# Patient Record
Sex: Male | Born: 1985 | Race: White | Hispanic: No | Marital: Married | Smoking: Current every day smoker
Health system: Southern US, Community
[De-identification: ages and names within clinical notes are randomized; demographics above are authoritative.]

## PROBLEM LIST (undated history)

## (undated) DIAGNOSIS — F909 Attention-deficit hyperactivity disorder, unspecified type: Secondary | ICD-10-CM

## (undated) DIAGNOSIS — Z8781 Personal history of (healed) traumatic fracture: Secondary | ICD-10-CM

## (undated) HISTORY — PX: OTHER SURGICAL HISTORY: SHX169

## (undated) HISTORY — PX: CERVICAL SPINE SURGERY: SHX589

## (undated) HISTORY — DX: Attention-deficit hyperactivity disorder, unspecified type: F90.9

## (undated) HISTORY — DX: Personal history of (healed) traumatic fracture: Z87.81

---
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
## 2009-07-11 ENCOUNTER — Emergency Department (HOSPITAL_COMMUNITY): Admission: EM | Admit: 2009-07-11 | Discharge: 2009-07-11 | Payer: Self-pay | Admitting: Emergency Medicine
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
## 2015-10-04 ENCOUNTER — Encounter (HOSPITAL_COMMUNITY): Payer: Self-pay | Admitting: *Deleted

## 2015-10-04 ENCOUNTER — Emergency Department (HOSPITAL_COMMUNITY)
Admission: EM | Admit: 2015-10-04 | Discharge: 2015-10-05 | Disposition: A | Discharge status: Home / Self Care | Payer: 59 | Attending: Emergency Medicine | Admitting: Emergency Medicine

## 2015-10-04 ENCOUNTER — Emergency Department (HOSPITAL_COMMUNITY): Payer: 59

## 2015-10-04 DIAGNOSIS — R002 Palpitations: Secondary | ICD-10-CM

## 2015-10-04 DIAGNOSIS — Z87891 Personal history of nicotine dependence: Secondary | ICD-10-CM | POA: Insufficient documentation

## 2015-10-04 DIAGNOSIS — R079 Chest pain, unspecified: Secondary | ICD-10-CM | POA: Diagnosis not present

## 2015-10-04 LAB — BASIC METABOLIC PANEL
Anion gap: 9 (ref 5–15)
BUN: 11 mg/dL (ref 6–20)
CO2: 26 mmol/L (ref 22–32)
Calcium: 9.5 mg/dL (ref 8.9–10.3)
Chloride: 101 mmol/L (ref 101–111)
Creatinine, Ser: 0.9 mg/dL (ref 0.61–1.24)
GFR calc Af Amer: >60 mL/min (ref >60)
GFR calc non Af Amer: >60 mL/min (ref >60)
Glucose, Bld: 101 mg/dL — ABNORMAL HIGH (ref 65–99)
Potassium: 3.5 mmol/L (ref 3.5–5.1)
Sodium: 136 mmol/L (ref 135–145)

## 2015-10-04 LAB — CBC WITH DIFFERENTIAL/PLATELET
Basophils Absolute: 0 10*3/uL (ref 0.0–0.1)
Basophils Relative: 0 %
Eosinophils Absolute: 0.2 10*3/uL (ref 0.0–0.7)
Eosinophils Relative: 2 %
HCT: 45.5 % (ref 39.0–52.0)
Hemoglobin: 15.9 g/dL (ref 13.0–17.0)
Lymphocytes Relative: 24 %
Lymphs Abs: 1.8 10*3/uL (ref 0.7–4.0)
MCH: 30.3 pg (ref 26.0–34.0)
MCHC: 34.9 g/dL (ref 30.0–36.0)
MCV: 86.7 fL (ref 78.0–100.0)
Monocytes Absolute: 0.8 10*3/uL (ref 0.1–1.0)
Monocytes Relative: 11 %
Neutro Abs: 4.8 10*3/uL (ref 1.7–7.7)
Neutrophils Relative %: 63 %
Platelets: 188 10*3/uL (ref 150–400)
RBC: 5.25 MIL/uL (ref 4.22–5.81)
RDW: 12.3 % (ref 11.5–15.5)
WBC: 7.5 10*3/uL (ref 4.0–10.5)

## 2015-10-04 LAB — I-STAT TROPONIN, ED: Troponin i, poc: 0 ng/mL (ref 0.00–0.08)

## 2015-10-04 NOTE — ED Provider Notes (Signed)
CSN: [id number]     Arrival date & time 10/04/15  2223 History  By signing my name below, I, Doreatha MartinEva Mathews, attest that this documentation has been prepared under the direction and in the presence of Geoffery Lyonsouglas Shatarra Wehling, MD. Electronically Signed: Doreatha MartinEva Mathews, ED Scribe. 10/04/2015. 11:47 PM.    Chief Complaint  Patient presents with  . Chest Pain   Patient is a 29 y.o. male presenting with palpitations. The history is provided by the patient. No language interpreter was used.  Palpitations Palpitations quality:  Regular (fluttering) Onset quality:  Sudden Duration:  3 seconds Timing:  Intermittent Progression:  Improving Chronicity:  New Context: not caffeine and not nicotine   Associated symptoms: no chest pain, no lower extremity edema and no shortness of breath   Risk factors: no diabetes mellitus and no heart disease     HPI Comments: Rosalio MacadamiaMatthew A Clymer is a 29 y.o. male who presents to the Emergency Department complaining of multiple episodes of a sensation of heart palpitations, described as fluttering and lasting a few seconds, onset 2 hours ago while at rest. No episodes of fluttering since arrival. FHx of sudden cardiac death from long QT syndrome at age 29 from twin sister. Per EMS, his EKG showed an inverted T wave. H/o stress test 10 years ago with no acute findings. Otherwise healthy. No daily medications. 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 No h/o DM, HLD, HTN. Pt is a current non-smoker (former smoker). He denies caffiene consumption, daily alcohol use. He also denies any CP, leg swelling, SOB.  History reviewed. No pertinent past medical history. Past Surgical History  Procedure Laterality Date  . Broken neck     No family history on file. Social History  Substance Use Topics  . Smoking status: Former Games developermoker  . Smokeless tobacco: Current User  . Alcohol Use: Yes     Comment: ocassionally    Review of Systems  Respiratory: Negative for shortness of  breath.   Cardiovascular: Positive for palpitations. Negative for chest pain and leg swelling.  All other systems reviewed and are negative.  Allergies  Other  Home Medications   Prior to Admission medications   Not on File   BP 132/59 mmHg  Pulse 75  Temp(Src) 98.5 F (36.9 C) (Oral)  Resp 12  Ht 5\' 10"  (1.778 m)  Wt 225 lb (102.059 kg)  BMI 32.28 kg/m2  SpO2 98% Physical Exam  Constitutional: He is oriented to person, place, and time. He appears well-developed and well-nourished.  HENT:  Head: Normocephalic and atraumatic.  Eyes: EOM are normal.  Neck: Normal range of motion.  Cardiovascular: Normal rate, regular rhythm, normal heart sounds and intact distal pulses.   Pulmonary/Chest: Effort normal and breath sounds normal. No respiratory distress.  Lungs CTA bilaterally.   Abdominal: Soft. He exhibits no distension. There is no tenderness.  Musculoskeletal: Normal range of motion.  Neurological: He is alert and oriented to person, place, and time.  Skin: Skin is warm and dry.  Psychiatric: He has a normal mood and affect. Judgment normal.  Nursing note and vitals reviewed.  ED Course  Procedures (including critical care time) DIAGNOSTIC STUDIES: Oxygen Saturation is 98% on RA, normal by my interpretation.    COORDINATION OF CARE: 11:26 PM Discussed treatment plan with pt at bedside and pt agreed to plan.   Labs Review Labs Reviewed  BASIC METABOLIC PANEL - Abnormal; Notable for the following:    Glucose, Bld  101 (*)    All other components within normal limits  CBC WITH DIFFERENTIAL/PLATELET  Rosezena Sensor, ED    Imaging Review Dg Chest 2 View  10/04/2015  CLINICAL DATA:  Acute onset of chest pain and palpitations. EXAM: CHEST  2 VIEW COMPARISON:  None. FINDINGS: The heart size and mediastinal contours are within normal limits. Both lungs are clear. The visualized skeletal structures are unremarkable. IMPRESSION: Normal chest x-ray. Electronically Signed    By: Rudie Meyer M.D.   On: 10/04/2015 23:10   I have personally reviewed and evaluated these images and lab results as part of my medical decision-making.   EKG Interpretation   Date/Time:  Saturday October 04 2015 22:32:10 EDT Ventricular Rate:  90 PR Interval:  144 QRS Duration: 86 QT Interval:  352 QTC Calculation: 430 R Axis:   38 Text Interpretation:  Normal sinus rhythm Normal ECG Confirmed by Terion Hedman   MD, Gamal Todisco ([id number]) on 10/04/2015 11:00:09 PM      MDM   Final diagnoses:  None    Patient presents with palpitations that started at home while watching television. He is now in a normal sinus rhythm with no ectopy and normal ekg.  Labs are unremarkable.  Will discharge to home, to follow up with cardiology if symptoms persist to discuss holter monitoring.  I personally performed the services described in this documentation, which was scribed in my presence. The recorded information has been reviewed and is accurate.       Geoffery Lyons, MD 10/05/15 [phone]

## 2015-10-04 NOTE — ED Notes (Signed)
Patient stated he was watching TV and felt like his heart was fluttering.  Called EMS and they ran an EKG and it showed inverted T waves

## 2015-10-05 NOTE — Discharge Instructions (Signed)
Limit your caffeine and alcohol intake.  If symptoms persist, call the cardiology clinic to set up a follow up appointment to discuss holter monitoring.  Their contact information has been provided in this discharge summary.   Palpitations A palpitation is the feeling that your heartbeat is irregular or is faster than normal. It may feel like your heart is fluttering or skipping a beat. Palpitations are usually not a serious problem. However, in some cases, you may need further medical evaluation. CAUSES  Palpitations can be caused by:  Smoking.  Caffeine or other stimulants, such as diet pills or energy drinks.  Alcohol.  Stress and anxiety.  Strenuous physical activity.  Fatigue.  Certain medicines.  Heart disease, especially if you have a history of irregular heart rhythms (arrhythmias), such as atrial fibrillation, atrial flutter, or supraventricular tachycardia.  An improperly working pacemaker or defibrillator. DIAGNOSIS  To find the cause of your palpitations, your health care provider will take your medical history and perform a physical exam. Your health care provider may also have you take a test called an ambulatory electrocardiogram (ECG). An ECG records your heartbeat patterns over a 24-hour period. You may also have other tests, such as:  Transthoracic echocardiogram (TTE). During echocardiography, sound waves are used to evaluate how blood flows through your heart.  Transesophageal echocardiogram (TEE).  Cardiac monitoring. This allows your health care provider to monitor your heart rate and rhythm in real time.  Holter monitor. This is a portable device that records your heartbeat and can help diagnose heart arrhythmias. It allows your health care provider to track your heart activity for several days, if needed.  Stress tests by exercise or by giving medicine that makes the heart beat faster. TREATMENT  Treatment of palpitations depends on the cause of your  symptoms and can vary greatly. Most cases of palpitations do not require any treatment other than time, relaxation, and monitoring your symptoms. Other causes, such as atrial fibrillation, atrial flutter, or supraventricular tachycardia, usually require further treatment. HOME CARE INSTRUCTIONS   Avoid:  Caffeinated coffee, tea, soft drinks, diet pills, and energy drinks.  Chocolate.  Alcohol.  Stop smoking if you smoke.  Reduce your stress and anxiety. Things that can help you relax include:  A method of controlling things in your body, such as your heartbeats, with your mind (biofeedback).  Yoga.  Meditation.  Physical activity such as swimming, jogging, or walking.  Get plenty of rest and sleep. SEEK MEDICAL CARE IF:   You continue to have a fast or irregular heartbeat beyond 24 hours.  Your palpitations occur more often. SEEK IMMEDIATE MEDICAL CARE IF:  You have chest pain or shortness of breath.  You have a severe headache.  You feel dizzy or you faint. MAKE SURE YOU:  Understand these instructions.  Will watch your condition.  Will get help right away if you are not doing well or get worse.   This information is not intended to replace advice given to you by your health care provider. Make sure you discuss any questions you have with your health care provider.   Document Released: 11/12/2000 Document Revised: 11/20/2013 Document Reviewed: 01/14/2012 Elsevier Interactive Patient Education [employer]2016 Elsevier Inc.

## 2015-10-05 NOTE — ED Notes (Signed)
Pt stable, ambulatory, states understanding of discharge instructions 

## 2016-03-10 ENCOUNTER — Encounter: Payer: Self-pay | Admitting: Podiatry

## 2016-03-10 ENCOUNTER — Ambulatory Visit (INDEPENDENT_AMBULATORY_CARE_PROVIDER_SITE_OTHER): Payer: BLUE CROSS/BLUE SHIELD | Admitting: Podiatry

## 2016-03-10 VITALS — BP 135/87 | HR 81 | Resp 16

## 2016-03-10 DIAGNOSIS — B353 Tinea pedis: Secondary | ICD-10-CM | POA: Diagnosis not present

## 2016-03-10 DIAGNOSIS — B351 Tinea unguium: Secondary | ICD-10-CM

## 2016-03-10 MED ORDER — NAFTIFINE HCL 2 % EX GEL: 1.0000 "application " | Freq: Two times a day (BID) | CUTANEOUS | Status: DC

## 2016-03-10 MED ORDER — TERBINAFINE HCL 250 MG PO TABS: 250.0000 mg | ORAL_TABLET | Freq: Every day | ORAL | Status: DC

## 2016-03-10 NOTE — Progress Notes (Signed)
   Subjective:    Patient ID: Tony Thornton, male    DOB: 11/25/1986, 30 y.o.   MRN: [id number]  HPI  Chief Complaint  Patient presents with  . Nail Problem    Toenails bilateral - thick, discolored nails for several years, worsening  . Skin Problem    Plantar feet and around toes bilateral - itching, peeling, redness, tried several OTC meds, creams and sprays, change socks through the day-no help.      Review of Systems  All other systems reviewed and are negative.      Objective:   Physical Exam        Assessment & Plan:

## 2016-03-11 LAB — HEPATIC FUNCTION PANEL
ALT: 26 U/L (ref 9–46)
AST: 22 U/L (ref 10–40)
Albumin: 4.5 g/dL (ref 3.6–5.1)
Alkaline Phosphatase: 82 U/L (ref 40–115)
Bilirubin, Direct: 0.1 mg/dL (ref <=0.2)
Indirect Bilirubin: 0.3 mg/dL (ref 0.2–1.2)
Total Bilirubin: 0.4 mg/dL (ref 0.2–1.2)
Total Protein: 6.6 g/dL (ref 6.1–8.1)

## 2016-03-11 NOTE — Progress Notes (Signed)
Subjective:     Patient ID: Rosalio MacadamiaMatthew A Poss, male   DOB: December 24, 1985, 30 y.o.   MRN: [id number]  HPI patient presents stating that his toenails are thick and he gets cracking between his toes and he gets redness when he wears boots and he has to wear boots with work and he does have sweating and dry skin   Review of Systems  All other systems reviewed and are negative.      Objective:   Physical Exam  Constitutional: He is oriented to person, place, and time.  Cardiovascular: Intact distal pulses.   Musculoskeletal: Normal range of motion.  Neurological: He is oriented to person, place, and time.  Skin: Skin is warm.  Nursing note and vitals reviewed.  Neurovascular status intact muscle strength adequate range of motion within normal limits with patient found to have nail disease 1-5 both feet with irritation of tissue between all digits and redness of his forefoot bilateral. He gets a lot of itching mechanisms and he does get occasional blisters. Was found to have good digital perfusion and is well oriented 3     Assessment:     Fungal infection with nail disease also noted    Plan:     H&P conditions reviewed and I've recommended Lamisil 90 days 1 per day and we'll also get liver function studies prior to initiating treatment. I did send his prescription off today and gave him a form to get blood work done and also he will start form and done to try to control sweating

## 2016-03-15 ENCOUNTER — Telehealth: Payer: Self-pay | Admitting: *Deleted

## 2016-03-15 MED ORDER — NAFTIFINE HCL 2 % EX GEL: 1.0000 "application " | Freq: Two times a day (BID) | CUTANEOUS | Status: DC

## 2016-03-15 NOTE — Telephone Encounter (Addendum)
[facility] pharmacy to [facility] for prior authorization purposes.  03/17/2016-Pt states he was to get a gel for his feet, but his insurance did not cover.  I told pt I had changed the rx to a pharmacy that would process for prior authorization, that he could wait for that medication or I could see if Dr. Charlsie Merlesegal would change to another.  Pt states he would like to see if Dr. Charlsie Merlesegal would change to another medication. 03/18/2016-I spoke with Tamera PuntMiranda - [employer] and she states Ketoconazole cream 60 grams is covered and will cost pt $74.00 for 30 days, Lotrimin AF Cream is $10.00 depending on the pharmacy and not covered.  I informed pt of the options and he states he would prefer the Lotrimin AF. Orders to [facility]'s [facility].

## 2016-03-18 MED ORDER — CLOTRIMAZOLE 1 % EX CREA: 1.0000 "application " | TOPICAL_CREAM | Freq: Two times a day (BID) | CUTANEOUS | Status: DC

## 2016-03-23 ENCOUNTER — Telehealth: Payer: Self-pay | Admitting: *Deleted

## 2016-04-07 ENCOUNTER — Telehealth: Payer: Self-pay | Admitting: *Deleted

## 2016-04-07 MED ORDER — NAFTIFINE HCL 2 % EX GEL: 1.0000 [drp] | Freq: Two times a day (BID) | CUTANEOUS | Status: DC

## 2016-04-07 NOTE — Telephone Encounter (Signed)
Pre-cert for Naftin 2% gel, Key:  [id number], valid 04/02/2016 - 11/28/2038. Faxed to [facility] [id number].

## 2016-04-07 NOTE — Telephone Encounter (Signed)
Dr. Charlsie Merlesegal reviewed 03/10/2016 blood work as with in normal limits may take the lamisil.  I also informed pt, our office had received pre-cert for the Naftin gel if he would like to pick that up as well.  Pt states he will get both medication.

## 2016-05-01 IMAGING — DX DG CHEST 2V
2 series · 2 of 2 positions shown · non-contrast
Comparison: None.

CLINICAL DATA: Acute onset of chest pain and palpitations.

EXAM:
CHEST  2 VIEW

[chest pa]
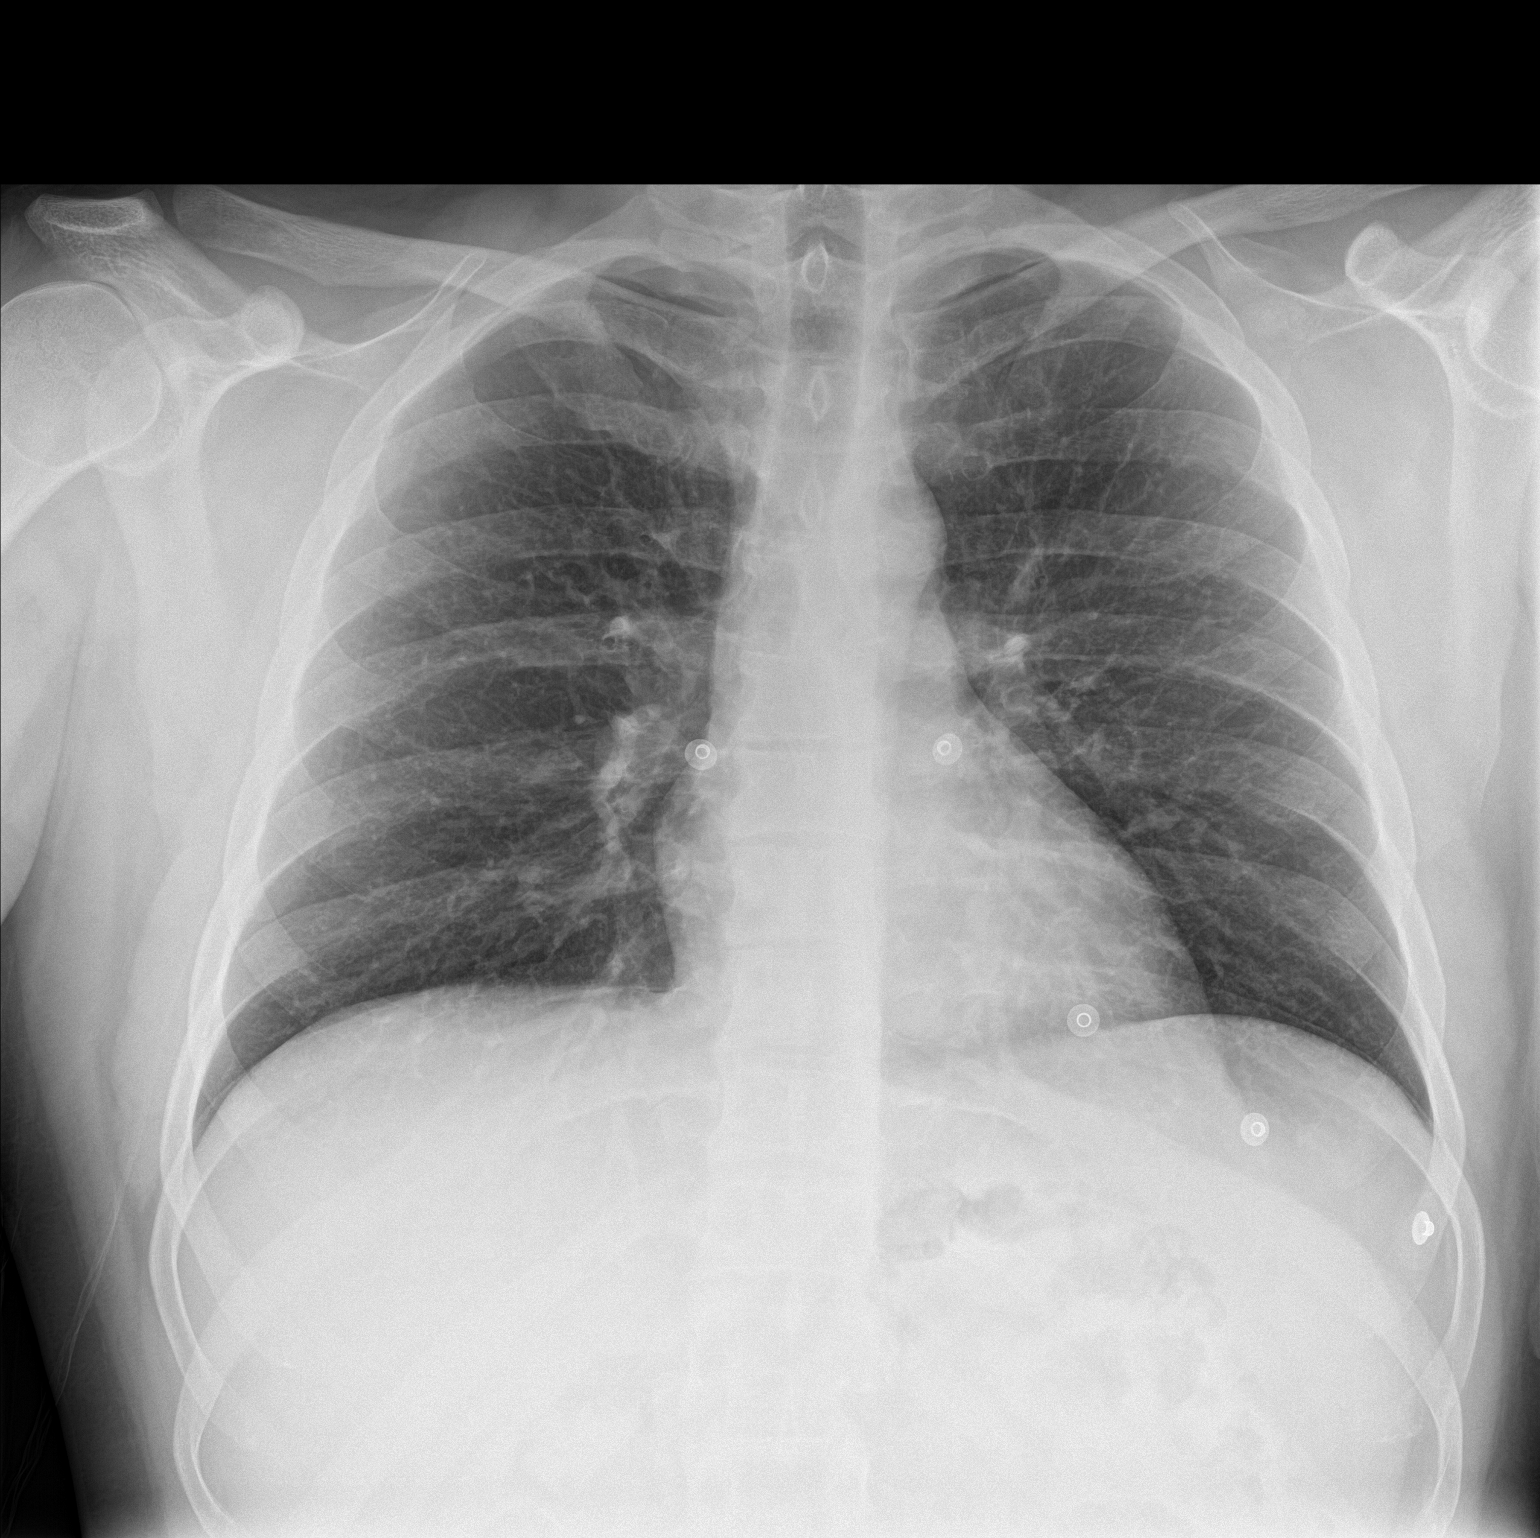

[chest lat]
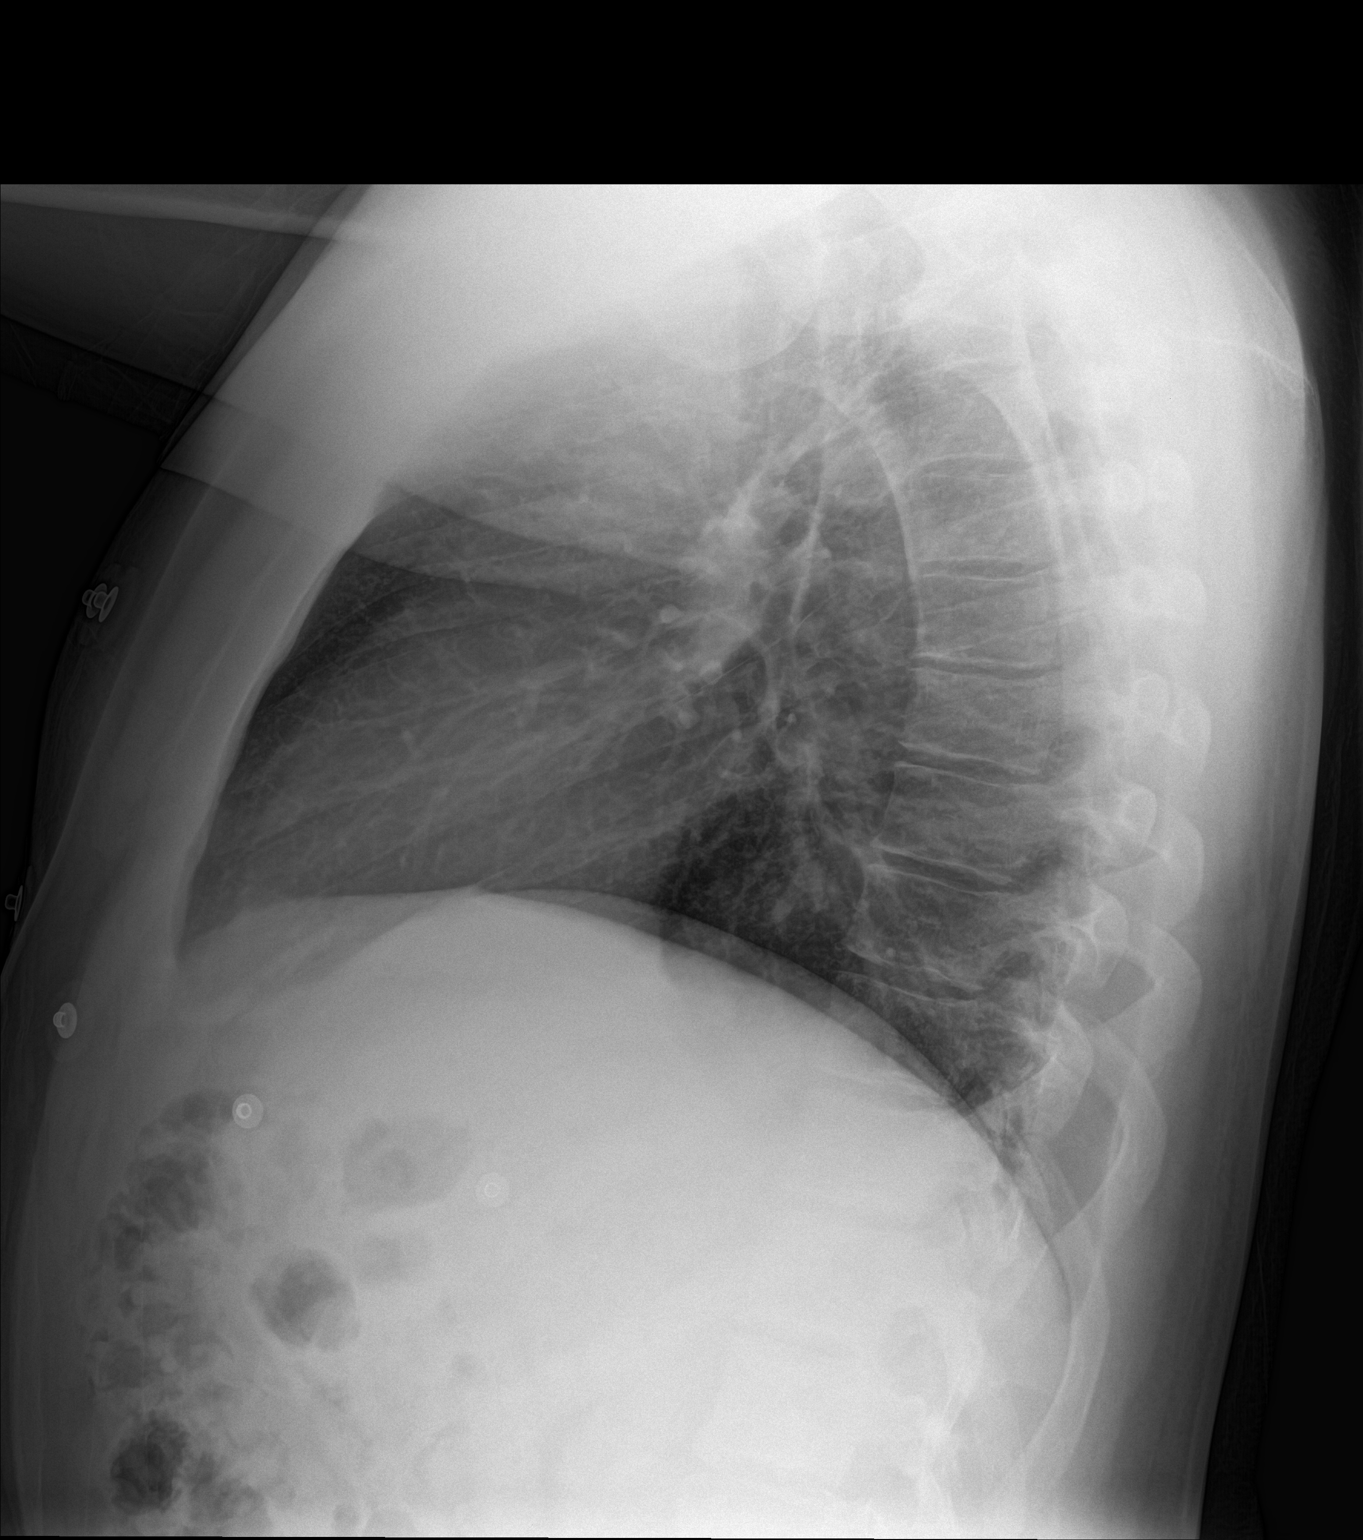

[2 of 2 positions shown; findings below may reference images not displayed]

FINDINGS: The heart size and mediastinal contours are within normal limits.
Both lungs are clear. The visualized skeletal structures are
unremarkable.
IMPRESSION: Normal chest x-ray.

## 2016-07-07 ENCOUNTER — Ambulatory Visit (INDEPENDENT_AMBULATORY_CARE_PROVIDER_SITE_OTHER): Payer: BLUE CROSS/BLUE SHIELD | Admitting: Podiatry

## 2016-07-07 DIAGNOSIS — B351 Tinea unguium: Secondary | ICD-10-CM | POA: Diagnosis not present

## 2016-07-07 DIAGNOSIS — B353 Tinea pedis: Secondary | ICD-10-CM

## 2016-07-07 MED ORDER — TERBINAFINE HCL 250 MG PO TABS: ORAL_TABLET | ORAL | 0 refills | Status: DC

## 2016-07-08 NOTE — Progress Notes (Signed)
Subjective:     Patient ID: Rosalio MacadamiaMatthew A Cupo, male   DOB: 1986/06/07, 30 y.o.   MRN: [id number]  HPI patient presents stating that he is doing better and the discoloration has resolved and his skin is much better   Review of Systems     Objective:   Physical Exam Neurovascular status intact muscle strength adequate with patient noted to have significant reduction of discoloration nailbeds with some still distal yellow numbness and thickness with skin that's much improved in the interdigital areas    Assessment:     Doing much better from having significant fungal infection of nails and skin bilateral    Plan:     Reviewed condition and recommended that we do pulse Lamisil but not to start for about 4 months. I wrote a prescription today we'll start December and I educated him on this condition and we will hopefully be able to utilize pulse therapy in the future to control. He'll be seen back to recheck as needed

## 2017-09-21 DIAGNOSIS — T23222A Burn of second degree of single left finger (nail) except thumb, initial encounter: Secondary | ICD-10-CM | POA: Diagnosis not present

## 2017-12-08 ENCOUNTER — Other Ambulatory Visit: Payer: Self-pay | Admitting: Adult Health

## 2017-12-08 ENCOUNTER — Encounter: Payer: Self-pay | Admitting: Adult Health

## 2017-12-08 ENCOUNTER — Encounter: Payer: Self-pay | Admitting: General Practice

## 2017-12-08 ENCOUNTER — Ambulatory Visit (INDEPENDENT_AMBULATORY_CARE_PROVIDER_SITE_OTHER): Payer: BLUE CROSS/BLUE SHIELD | Admitting: Adult Health

## 2017-12-08 VITALS — BP 124/73 | HR 79 | Ht 69.0 in | Wt 229.6 lb

## 2017-12-08 DIAGNOSIS — Z23 Encounter for immunization: Secondary | ICD-10-CM

## 2017-12-08 DIAGNOSIS — Z0000 Encounter for general adult medical examination without abnormal findings: Secondary | ICD-10-CM

## 2017-12-08 DIAGNOSIS — Z114 Encounter for screening for human immunodeficiency virus [HIV]: Secondary | ICD-10-CM | POA: Diagnosis not present

## 2017-12-08 DIAGNOSIS — Z83438 Family history of other disorder of lipoprotein metabolism and other lipidemia: Secondary | ICD-10-CM | POA: Diagnosis not present

## 2017-12-08 DIAGNOSIS — F909 Attention-deficit hyperactivity disorder, unspecified type: Secondary | ICD-10-CM | POA: Diagnosis not present

## 2017-12-08 DIAGNOSIS — R5383 Other fatigue: Secondary | ICD-10-CM

## 2017-12-08 DIAGNOSIS — Z7251 High risk heterosexual behavior: Secondary | ICD-10-CM

## 2017-12-08 DIAGNOSIS — Z8349 Family history of other endocrine, nutritional and metabolic diseases: Secondary | ICD-10-CM | POA: Diagnosis not present

## 2017-12-08 DIAGNOSIS — N529 Male erectile dysfunction, unspecified: Secondary | ICD-10-CM

## 2017-12-08 DIAGNOSIS — R8290 Unspecified abnormal findings in urine: Secondary | ICD-10-CM | POA: Diagnosis not present

## 2017-12-08 LAB — POCT URINALYSIS DIPSTICK
Bilirubin, UA: NEGATIVE
Blood, UA: NEGATIVE
Glucose, UA: NEGATIVE
Ketones, UA: NEGATIVE
Leukocytes, UA: NEGATIVE
Nitrite, UA: NEGATIVE
Protein, UA: NEGATIVE
Spec Grav, UA: 1.025 (ref 1.010–1.025)
Urobilinogen, UA: 1 E.U./dL
pH, UA: 6.5 (ref 5.0–8.0)

## 2017-12-08 MED ORDER — SILDENAFIL CITRATE 50 MG PO TABS: ORAL_TABLET | ORAL | 0 refills | Status: DC

## 2017-12-08 NOTE — Assessment & Plan Note (Signed)
Continue excellent water intake and reduce saturated fat intake. We will call you when urine results are available. Please schedule fasting lab appt and return ADD/ADHD questionnaire paperwork. Please schedule complete physical in the next few months.

## 2017-12-08 NOTE — Patient Instructions (Addendum)
Heart-Healthy Eating Plan Many factors influence your heart health, including eating and exercise habits. Heart (coronary) risk increases with abnormal blood fat (lipid) levels. Heart-healthy meal planning includes limiting unhealthy fats, increasing healthy fats, and making other small dietary changes. This includes maintaining a healthy body weight to help keep lipid levels within a normal range. What is my plan? Your health care provider recommends that you:  Get no more than __25___% of the total calories in your daily diet from fat.  Limit your intake of saturated fat to less than ____5____% of your total calories each day.  Limit the amount of cholesterol in your diet to less than _300__ mg per day.  What types of fat should I choose?  Choose healthy fats more often. Choose monounsaturated and polyunsaturated fats, such as olive oil and canola oil, flaxseeds, walnuts, almonds, and seeds.  Eat more omega-3 fats. Good choices include salmon, mackerel, sardines, tuna, flaxseed oil, and ground flaxseeds. Aim to eat fish at least two times each week.  Limit saturated fats. Saturated fats are primarily found in animal products, such as meats, butter, and cream. Plant sources of saturated fats include palm oil, palm kernel oil, and coconut oil.  Avoid foods with partially hydrogenated oils in them. These contain trans fats. Examples of foods that contain trans fats are stick margarine, some tub margarines, cookies, crackers, and other baked goods. What general guidelines do I need to follow?  Check food labels carefully to identify foods with trans fats or high amounts of saturated fat.  Fill one half of your plate with vegetables and green salads. Eat 4-5 servings of vegetables per day. A serving of vegetables equals 1 cup of raw leafy vegetables,  cup of raw or cooked cut-up vegetables, or  cup of vegetable juice.  Fill one fourth of your plate with whole grains. Look for the word  "whole" as the first word in the ingredient list.  Fill one fourth of your plate with lean protein foods.  Eat 4-5 servings of fruit per day. A serving of fruit equals one medium whole fruit,  cup of dried fruit,  cup of fresh, frozen, or canned fruit, or  cup of 100% fruit juice.  Eat more foods that contain soluble fiber. Examples of foods that contain this type of fiber are apples, broccoli, carrots, beans, peas, and barley. Aim to get 20-30 g of fiber per day.  Eat more home-cooked food and less restaurant, buffet, and fast food.  Limit or avoid alcohol.  Limit foods that are high in starch and sugar.  Avoid fried foods.  Cook foods by using methods other than frying. Baking, boiling, grilling, and broiling are all great options. Other fat-reducing suggestions include: ? Removing the skin from poultry. ? Removing all visible fats from meats. ? Skimming the fat off of stews, soups, and gravies before serving them. ? Steaming vegetables in water or broth.  Lose weight if you are overweight. Losing just 5-10% of your initial body weight can help your overall health and prevent diseases such as diabetes and heart disease.  Increase your consumption of nuts, legumes, and seeds to 4-5 servings per week. One serving of dried beans or legumes equals  cup after being cooked, one serving of nuts equals 1 ounces, and one serving of seeds equals  ounce or 1 tablespoon.  You may need to monitor your salt (sodium) intake, especially if you have high blood pressure. Talk with your health care provider or dietitian to get  more information about reducing sodium. What foods can I eat? Grains  Breads, including French, white, pita, wheat, raisin, rye, oatmeal, and Italian. Tortillas that are neither fried nor made with lard or trans fat. Low-fat rolls, including hotdog and hamburger buns and English muffins. Biscuits. Muffins. Waffles. Pancakes. Light popcorn. Whole-grain cereals. Flatbread.  Melba toast. Pretzels. Breadsticks. Rusks. Low-fat snacks and crackers, including oyster, saltine, matzo, graham, animal, and rye. Rice and pasta, including brown rice and those that are made with whole wheat. Vegetables All vegetables. Fruits All fruits, but limit coconut. Meats and Other Protein Sources Lean, well-trimmed beef, veal, pork, and lamb. Chicken and turkey without skin. All fish and shellfish. Wild duck, rabbit, pheasant, and venison. Egg whites or low-cholesterol egg substitutes. Dried beans, peas, lentils, and tofu.Seeds and most nuts. Dairy Low-fat or nonfat cheeses, including ricotta, string, and mozzarella. Skim or 1% milk that is liquid, powdered, or evaporated. Buttermilk that is made with low-fat milk. Nonfat or low-fat yogurt. Beverages Mineral water. Diet carbonated beverages. Sweets and Desserts Sherbets and fruit ices. Honey, jam, marmalade, jelly, and syrups. Meringues and gelatins. Pure sugar candy, such as hard candy, jelly beans, gumdrops, mints, marshmallows, and small amounts of dark chocolate. Angel food cake. Eat all sweets and desserts in moderation. Fats and Oils Nonhydrogenated (trans-free) margarines. Vegetable oils, including soybean, sesame, sunflower, olive, peanut, safflower, corn, canola, and cottonseed. Salad dressings or mayonnaise that are made with a vegetable oil. Limit added fats and oils that you use for cooking, baking, salads, and as spreads. Other Cocoa powder. Coffee and tea. All seasonings and condiments. The items listed above may not be a complete list of recommended foods or beverages. Contact your dietitian for more options. What foods are not recommended? Grains Breads that are made with saturated or trans fats, oils, or whole milk. Croissants. Butter rolls. Cheese breads. Sweet rolls. Donuts. Buttered popcorn. Chow mein noodles. High-fat crackers, such as cheese or butter crackers. Meats and Other Protein Sources Fatty meats, such  as hotdogs, short ribs, sausage, spareribs, bacon, ribeye roast or steak, and mutton. High-fat deli meats, such as salami and bologna. Caviar. Domestic duck and goose. Organ meats, such as kidney, liver, sweetbreads, brains, gizzard, chitterlings, and heart. Dairy Cream, sour cream, cream cheese, and creamed cottage cheese. Whole milk cheeses, including blue (bleu), Monterey Jack, Brie, [place], American, Havarti, Swiss, cheddar, Camembert, and [place]. Whole or 2% milk that is liquid, evaporated, or condensed. Whole buttermilk. Cream sauce or high-fat cheese sauce. Yogurt that is made from whole milk. Beverages Regular sodas and drinks with added sugar. Sweets and Desserts Frosting. Pudding. Cookies. Cakes other than angel food cake. Candy that has milk chocolate or white chocolate, hydrogenated fat, butter, coconut, or unknown ingredients. Buttered syrups. Full-fat ice cream or ice cream drinks. Fats and Oils Gravy that has suet, meat fat, or shortening. Cocoa butter, hydrogenated oils, palm oil, coconut oil, palm kernel oil. These can often be found in baked products, candy, fried foods, nondairy creamers, and whipped toppings. Solid fats and shortenings, including bacon fat, salt pork, lard, and butter. Nondairy cream substitutes, such as coffee creamers and sour cream substitutes. Salad dressings that are made of unknown oils, cheese, or sour cream. The items listed above may not be a complete list of foods and beverages to avoid. Contact your dietitian for more information. This information is not intended to replace advice given to you by your health care provider. Make sure you discuss any questions you have with your health care   provider. Document Released: 08/24/2008 Document Revised: 06/04/2016 Document Reviewed: 05/09/2014 Elsevier Interactive Patient Education  2018 [place].   Neck Exercises Neck exercises can be important for many reasons:  They can help you to improve and  maintain flexibility in your neck. This can be especially important as you age.  They can help to make your neck stronger. This can make movement easier.  They can reduce or prevent neck pain.  They may help your upper back.  Ask your health care provider which neck exercises would be best for you. Exercises Neck Press Repeat this exercise 10 times. Do it first thing in the morning and right before bed or as told by your health care provider. 1. Lie on your back on a firm bed or on the floor with a pillow under your head. 2. Use your neck muscles to push your head down on the pillow and straighten your spine. 3. Hold the position as well as you can. Keep your head facing up and your chin tucked. 4. Slowly count to 5 while holding this position. 5. Relax for a few seconds. Then repeat.  Isometric Strengthening Do a full set of these exercises 2 times a day or as told by your health care provider. 1. Sit in a supportive chair and place your hand on your forehead. 2. Push forward with your head and neck while pushing back with your hand. Hold for 10 seconds. 3. Relax. Then repeat the exercise 3 times. 4. Next, do thesequence again, this time putting your hand against the back of your head. Use your head and neck to push backward against the hand pressure. 5. Finally, do the same exercise on either side of your head, pushing sideways against the pressure of your hand.  Prone Head Lifts Repeat this exercise 5 times. Do this 2 times a day or as told by your health care provider. 1. Lie face-down, resting on your elbows so that your chest and upper back are raised. 2. Start with your head facing downward, near your chest. Position your chin either on or near your chest. 3. Slowly lift your head upward. Lift until you are looking straight ahead. Then continue lifting your head as far back as you can stretch. 4. Hold your head up for 5 seconds. Then slowly lower it to your starting  position.  Supine Head Lifts Repeat this exercise 8-10 times. Do this 2 times a day or as told by your health care provider. 1. Lie on your back, bending your knees to point to the ceiling and keeping your feet flat on the floor. 2. Lift your head slowly off the floor, raising your chin toward your chest. 3. Hold for 5 seconds. 4. Relax and repeat.  Scapular Retraction Repeat this exercise 5 times. Do this 2 times a day or as told by your health care provider. 1. Stand with your arms at your sides. Look straight ahead. 2. Slowly pull both shoulders backward and downward until you feel a stretch between your shoulder blades in your upper back. 3. Hold for 10-30 seconds. 4. Relax and repeat.  Contact a health care provider if:  Your neck pain or discomfort gets much worse when you do an exercise.  Your neck pain or discomfort does not improve within 2 hours after you exercise. If you have any of these problems, stop exercising right away. Do not do the exercises again unless your health care provider says that you can. Get help right away if:  You develop sudden, severe neck pain. If this happens, stop exercising right away. Do not do the exercises again unless your health care provider says that you can. Exercises Neck Stretch  Repeat this exercise 3-5 times. 1. Do this exercise while standing or while sitting in a chair. 2. Place your feet flat on the floor, shoulder-width apart. 3. Slowly turn your head to the right. Turn it all the way to the right so you can look over your right shoulder. Do not tilt or tip your head. 4. Hold this position for 10-30 seconds. 5. Slowly turn your head to the left, to look over your left shoulder. 6. Hold this position for 10-30 seconds.  Neck Retraction Repeat this exercise 8-10 times. Do this 3-4 times a day or as told by your health care provider. 1. Do this exercise while standing or while sitting in a sturdy chair. 2. Look straight ahead.  Do not bend your neck. 3. Use your fingers to push your chin backward. Do not bend your neck for this movement. Continue to face straight ahead. If you are doing the exercise properly, you will feel a slight sensation in your throat and a stretch at the back of your neck. 4. Hold the stretch for 1-2 seconds. Relax and repeat.  This information is not intended to replace advice given to you by your health care provider. Make sure you discuss any questions you have with your health care provider. Document Released: 10/27/2015 Document Revised: 04/22/2016 Document Reviewed: 05/26/2015 Elsevier Interactive Patient Education  2018 [employer].   Erectile Dysfunction Erectile dysfunction (ED) is the inability to get or keep an erection in order to have sexual intercourse. Erectile dysfunction may include:  Inability to get an erection.  Lack of enough hardness of the erection to allow penetration.  Loss of the erection before sex is finished.  What are the causes? This condition may be caused by:  Certain medicines, such as: ? Pain relievers. ? Antihistamines. ? Antidepressants. ? Blood pressure medicines. ? Water pills (diuretics). ? Ulcer medicines. ? Muscle relaxants. ? Drugs.  Excessive drinking.  Psychological causes, such as: ? Anxiety. ? Depression. ? Sadness. ? Exhaustion. ? Performance fear. ? Stress.  Physical causes, such as: ? Artery problems. This may include diabetes, smoking, liver disease, or atherosclerosis. ? High blood pressure. ? Hormonal problems, such as low testosterone. ? Obesity. ? Nerve problems. This may include back or pelvic injuries, diabetes mellitus, multiple sclerosis, or Parkinson disease.  What are the signs or symptoms? Symptoms of this condition include:  Inability to get an erection.  Lack of enough hardness of the erection to allow penetration.  Loss of the erection before sex is finished.  Normal erections at some times, but  with frequent unsatisfactory episodes.  Low sexual satisfaction in either partner due to erection problems.  A curved penis occurring with erection. The curve may cause pain or the penis may be too curved to allow for intercourse.  Never having nighttime erections.  How is this diagnosed? This condition is often diagnosed by:  Performing a physical exam to find other diseases or specific problems with the penis.  Asking you detailed questions about the problem.  Performing blood tests to check for diabetes mellitus or to measure hormone levels.  Performing other tests to check for underlying health conditions.  Performing an ultrasound exam to check for scarring.  Performing a test to check blood flow to the penis.  Doing a sleep study at home  to measure nighttime erections.  How is this treated? This condition may be treated by:  Medicine taken by mouth to help you achieve an erection (oral medicine).  Hormone replacement therapy to replace low testosterone levels.  Medicine that is injected into the penis. Your health care provider may instruct you how to give yourself these injections at home.  Vacuum pump. This is a pump with a ring on it. The pump and ring are placed on the penis and used to create pressure that helps the penis become erect.  Penile implant surgery. In this procedure, you may receive: ? An inflatable implant. This consists of cylinders, a pump, and a reservoir. The cylinders can be inflated with a fluid that helps to create an erection, and they can be deflated after intercourse. ? A semi-rigid implant. This consists of two silicone rubber rods. The rods provide some rigidity. They are also flexible, so the penis can both curve downward in its normal position and become straight for sexual intercourse.  Blood vessel surgery, to improve blood flow to the penis. During this procedure, a blood vessel from a different part of the body is placed into the penis  to allow blood to flow around (bypass) damaged or blocked blood vessels.  Lifestyle changes, such as exercising more, losing weight, and quitting smoking.  Follow these instructions at home: Medicines  Take over-the-counter and prescription medicines only as told by your health care provider. Do not increase the dosage without first discussing it with your health care provider.  If you are using self-injections, perform injections as directed by your health care provider. Make sure to avoid any veins that are on the surface of the penis. After giving an injection, apply pressure to the injection site for 5 minutes. General instructions  Exercise regularly, as directed by your health care provider. Work with your health care provider to lose weight, if needed.  Do not use any products that contain nicotine or tobacco, such as cigarettes and e-cigarettes. If you need help quitting, ask your health care provider.  Before using a vacuum pump, read the instructions that come with the pump and discuss any questions with your health care provider.  Keep all follow-up visits as told by your health care provider. This is important. Contact a health care provider if:  You feel nauseous.  You vomit. Get help right away if:  You are taking oral or injectable medicines and you have an erection that lasts longer than 4 hours. If your health care provider is unavailable, go to the nearest emergency room for evaluation. An erection that lasts much longer than 4 hours can result in permanent damage to your penis.  You have severe pain in your groin or abdomen.  You develop redness or severe swelling of your penis.  You have redness spreading up into your groin or lower abdomen.  You are unable to urinate.  You experience chest pain or a rapid heart beat (palpitations) after taking oral medicines. Summary  Erectile dysfunction (ED) is the inability to get or keep an erection during sexual  intercourse. This problem can usually be treated successfully.  This condition is diagnosed based on a physical exam, your symptoms, and tests to determine the cause. Treatment varies depending on the cause, and may include medicines, hormone therapy, surgery, or vacuum pump.  You may need follow-up visits to make sure that you are using your medicines or devices correctly.  Get help right away if you are taking or  injecting medicines and you have an erection that lasts longer than 4 hours. This information is not intended to replace advice given to you by your health care provider. Make sure you discuss any questions you have with your health care provider. Document Released: 11/12/2000 Document Revised: 12/01/2016 Document Reviewed: 12/01/2016 Elsevier Interactive Patient Education  2017 [employer]   Continue excellent water intake and reduce saturated fat intake. We will call you when urine results are available. Please schedule fasting lab appt and return ADD/ADHD questionnaire paperwork. Please schedule complete physical in the next few months. Short rx for Viagra provided, try to reduce stress/anxiety, which should help reduce symptoms. WELCOME TO THE PRACTICE!

## 2017-12-08 NOTE — Progress Notes (Signed)
Subjective:    Patient ID: Tony Thornton, male    DOB: 1986-01-28, 32 y.o.   MRN: [id number]  HPI:  Tony Thornton is here to establish as a new pt.  Tony Thornton is a pleasant 32 year old male.  PMH:  Tobacco Use- pack/day for 14 years.  Declined smoking cessation today.  His twin sister passed away from suspected prolong QT interval.  She was found deceased at home at age 35.  Tony Thornton underwent full cardiac work-up at [facility]- all negative Tony Thornton experienced palpitations 2016- EMS transport to hospital, work-up negative Tony Thornton denies daily medications and has not been to PCP since his pediatrician 13 years ago. Tony Thornton had cervical neck fx at age 64, surgically repaired with ACDF. Tony Thornton has several issues to discuss today- 1) Malodorous, cloudy urine for the last 3 months.  Tony Thornton is in monogamous relationship for the last 3 months,and denies penile d/c, genital lesions or hx of STIs.  Tony Thornton denies flank pain or blood in urine/stool.  Tony Thornton denies this occurring before. 2) ED for the last 6 months, difficulty getting/maintaining erection.  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  Tony Thornton denies this occurring before. 3) Loud snoring 4) Neck stiffness- present for years and worsens when sleeping. 5) Hx of ADHD treated, last time Tony Thornton was on meds was in highschool. Tony Thornton is currently struggling with poor concentration and inability to focus.  Patient Care Team    Relationship Specialty Notifications Start End  Julaine Fusi, NP PCP - General Family Medicine  12/08/17     Patient Active Problem List   Diagnosis Date Noted  . Malodorous urine 12/08/2017  . Healthcare maintenance 12/08/2017  . Erectile dysfunction 12/08/2017  . ADHD 12/08/2017     Past Medical History:  Diagnosis Date  . ADHD   . H/O cervical fracture      Past Surgical History:  Procedure Laterality Date  . broken neck       Family History  Problem Relation Age of Onset  . Hypertension Father   . Alcohol abuse  Father   . Hyperlipidemia Father   . Early death Sister        age 74- unknown cause  . Dementia Maternal Grandmother   . COPD Maternal Grandfather        colon  . Hypertension Paternal Grandmother   . Hyperlipidemia Paternal Grandmother   . COPD Paternal Grandmother   . Hypothyroidism Paternal Grandmother   . Cancer Paternal Grandfather        esophageal     Social History   Substance and Sexual Activity  Drug Use No     Social History   Substance and Sexual Activity  Alcohol Use Yes   Comment: ocassionally     Social History   Tobacco Use  Smoking Status Current Every Day Smoker  . Packs/day: 1.00  . Years: 14.00  . Pack years: 14.00  . Types: Cigarettes  Smokeless Tobacco Former User     Outpatient Encounter Medications as of 12/08/2017  Medication Sig  . sildenafil (VIAGRA) 50 MG tablet 1/2 to 1 tablet as needed.  Max daily dose 100mg .  . [DISCONTINUED] clotrimazole (LOTRIMIN AF) 1 % cream Apply 1 application topically 2 (two) times daily.  . [DISCONTINUED] Naftifine HCl (NAFTIN) 2 % GEL Apply 1 drop topically 2 (two) times daily.  . [DISCONTINUED] terbinafine (LAMISIL) 250 MG tablet Take 1 tablet (250 mg total) by mouth  daily.  . [DISCONTINUED] terbinafine (LAMISIL) 250 MG tablet Please take one a day x 7days, repeat every 4 weeks x 4 months   No facility-administered encounter medications on file as of 12/08/2017.     Allergies: Other  Body mass index is 33.91 kg/m.  Blood pressure 124/73, pulse 79, height 5\' 9"  (1.753 m), weight 229 lb 9.6 oz (104.1 kg), SpO2 96 %.    Review of Systems  Constitutional: Positive for fatigue. Negative for activity change, appetite change, chills, diaphoresis, fever and unexpected weight change.  HENT: Negative for congestion.   Eyes: Negative for visual disturbance.  Respiratory: Negative for cough, chest tightness, shortness of breath and wheezing.   Cardiovascular: Negative for chest pain, palpitations and leg  swelling.  Gastrointestinal: Negative for abdominal distention, abdominal pain, blood in stool, constipation, diarrhea, nausea and vomiting.  Endocrine: Negative for cold intolerance, heat intolerance, polydipsia, polyphagia and polyuria.  Genitourinary: Negative for decreased urine volume, difficulty urinating, discharge, dysuria, flank pain, frequency, genital sores, hematuria, penile pain, penile swelling, scrotal swelling, testicular pain and urgency.  Musculoskeletal: Positive for arthralgias, back pain, myalgias and neck stiffness. Negative for gait problem, joint swelling and neck pain.  Skin: Negative for color change, pallor, rash and wound.  Neurological: Negative for dizziness and headaches.  Hematological: Does not bruise/bleed easily.  Psychiatric/Behavioral: Positive for decreased concentration and sleep disturbance. Negative for dysphoric mood. The patient is not nervous/anxious.        Objective:   Physical Exam  Constitutional: Tony Thornton appears well-developed and well-nourished. No distress.  HENT:  Head: Normocephalic and atraumatic.  Right Ear: External ear normal.  Left Ear: External ear normal.  Eyes: Conjunctivae are normal. Pupils are equal, round, and reactive to light.  Cardiovascular: Normal rate, regular rhythm, normal heart sounds and intact distal pulses.  No murmur heard. Pulmonary/Chest: Effort normal and breath sounds normal. No respiratory distress. Tony Thornton has no wheezes. Tony Thornton has no rales. Tony Thornton exhibits no tenderness.  Abdominal: Soft. Bowel sounds are normal. Tony Thornton exhibits no distension and no mass. There is no tenderness. There is no rebound, no guarding and no CVA tenderness.  Neurological: Tony Thornton is alert.  Skin: Skin is warm and dry. No rash noted. Tony Thornton is not diaphoretic. No erythema. No pallor.  Psychiatric: Tony Thornton has a normal mood and affect. His behavior is normal. Judgment and thought content normal.          Assessment & Plan:   1. Need for Tdap vaccination    2. Need for influenza vaccination   3. Healthcare maintenance   4. Family history of hyperlipidemia   5. Family history of hypothyroidism   6. Fatigue, unspecified type   7. Malodorous urine   8. Erectile dysfunction, unspecified erectile dysfunction type   9. Screening for HIV (human immunodeficiency virus)   10. High risk heterosexual behavior   11. Attention deficit hyperactivity disorder (ADHD), unspecified ADHD type     Malodorous urine UA negative Urine sent for STI testing  Healthcare maintenance Continue excellent water intake and reduce saturated fat intake. We will call you when urine results are available. Please schedule fasting lab appt and return ADD/ADHD questionnaire paperwork. Please schedule complete physical in the next few months.   Erectile dysfunction Short rx for Viagra provided, try to reduce stress/anxiety, which should help reduce symptoms. Denies CP/palpitations  ADHD ADHD questionnaire provided, please bring back and we will further discuss    FOLLOW-UP:  Return in about 3 months (around 03/08/2018) for CPE.

## 2017-12-08 NOTE — Assessment & Plan Note (Signed)
Short rx for Viagra provided, try to reduce stress/anxiety, which should help reduce symptoms. Denies CP/palpitations

## 2017-12-08 NOTE — Assessment & Plan Note (Signed)
UA negative Urine sent for STI testing

## 2017-12-08 NOTE — Assessment & Plan Note (Signed)
ADHD questionnaire provided, please bring back and we will further discuss

## 2017-12-11 LAB — GC/CHLAMYDIA PROBE AMP
Chlamydia trachomatis, NAA: NEGATIVE
Neisseria gonorrhoeae by PCR: NEGATIVE

## 2017-12-20 NOTE — Progress Notes (Addendum)
Subjective:    Patient ID: Tony Thornton, male    DOB: 07-20-1986, 32 y.o.   MRN: [id number]  HPI:  12/08/17 :Tony Thornton is here to establish as a new pt.  He is a pleasant 32 year old male.  PMH:  Tobacco Use- pack/day for 14 years.  Declined smoking cessation today.  His twin sister passed away from suspected prolong QT interval.  She was found deceased at home at age 31.  He underwent [facility]- all negative He experienced palpitations 2016- EMS transport to hospital, work-up negative He denies daily medications and has not been to PCP since his pediatrician 13 years ago. He had cervical neck fx at age 32, surgically repaired with ACDF. He has several issues to discuss today- 1) Malodorous, cloudy urine for the last 3 months.  He is in monogamous relationship for the last 3 months,and denies penile d/c, genital lesions or hx of STIs.  He denies flank pain or blood in urine/stool.  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 2) ED for the last 6 months, difficulty getting/maintaining erection.  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 3) Loud snoring 4) Neck stiffness- present for years and worsens when sleeping. 5) Hx of ADHD treated, last time he was on meds was in highschool. He is currently struggling with poor concentration and inability to focus.  12/22/17 OV: Tony Thornton is here for f/u ADHD Self Report Scale Sx Checklist Review He continues to struggle with poor concentration/focus and "wandering mind". He was previously on Concerta, Focalin, Adderall- all at same time when he went off medications around age 32. He denies BP issues, but did have some insomnia when all all three Rx's He denies CP/dyspnea/palpaitions. He continue to smoke pack/day- again declined smoking cessation. He continues to have concentrated, malodorous urine, however only with morning void- resolves throughout the day, he has  increased water intake. He has used Viagra a few times with "much success", denies medication SE.  PHQ Reviewed- denies thoughts of harming himself/others. Patient Care Team    Relationship Specialty Notifications Start End  Julaine Fusianford, Katy D, NP PCP - General Family Medicine  12/08/17     Patient Active Problem List   Diagnosis Date Noted  . Malodorous urine 12/08/2017  . Healthcare maintenance 12/08/2017  . Erectile dysfunction 12/08/2017  . ADHD 12/08/2017     Past Medical History:  Diagnosis Date  . ADHD   . H/O cervical fracture      Past Surgical History:  Procedure Laterality Date  . broken neck       Family History  Problem Relation Age of Onset  . Hypertension Father   . Alcohol abuse Father   . Hyperlipidemia Father   . Early death Sister        age 31- unknown cause  . Dementia Maternal Grandmother   . COPD Maternal Grandfather        colon  . Hypertension Paternal Grandmother   . Hyperlipidemia Paternal Grandmother   . COPD Paternal Grandmother   . Hypothyroidism Paternal Grandmother   . Cancer Paternal Grandfather        esophageal     Social History   Substance and Sexual Activity  Drug Use No     Social History   Substance and Sexual Activity  Alcohol Use Yes   Comment: ocassionally     Social History  Tobacco Use  Smoking Status Current Every Day Smoker  . Packs/day: 1.00  . Years: 14.00  . Pack years: 14.00  . Types: Cigarettes  Smokeless Tobacco Former User     Outpatient Encounter Medications as of 12/22/2017  Medication Sig  . sildenafil (VIAGRA) 50 MG tablet 1/2 to 1 tablet as needed.  Max daily dose 100mg .  . amphetamine-dextroamphetamine (ADDERALL XR) 20 MG 24 hr capsule 1 tab each morning for two weeks, then increase to 1 tab twice daily   No facility-administered encounter medications on file as of 12/22/2017.     Allergies: Other  Body mass index is 33.37 kg/m.  Blood pressure 109/71, pulse 79, weight 226  lb (102.5 kg), SpO2 97 %.    Review of Systems  Constitutional: Positive for fatigue. Negative for activity change, appetite change, chills, diaphoresis, fever and unexpected weight change.  HENT: Negative for congestion.   Eyes: Negative for visual disturbance.  Respiratory: Negative for cough, chest tightness, shortness of breath and wheezing.   Cardiovascular: Negative for chest pain, palpitations and leg swelling.  Gastrointestinal: Negative for abdominal distention, abdominal pain, blood in stool, constipation, diarrhea, nausea and vomiting.  Endocrine: Negative for cold intolerance, heat intolerance, polydipsia, polyphagia and polyuria.  Genitourinary: Negative for decreased urine volume, difficulty urinating, discharge, dysuria, flank pain, frequency, genital sores, hematuria, penile pain, penile swelling, scrotal swelling, testicular pain and urgency.  Musculoskeletal: Positive for arthralgias, back pain, myalgias and neck stiffness. Negative for gait problem, joint swelling and neck pain.  Skin: Negative for color change, pallor, rash and wound.  Neurological: Negative for dizziness and headaches.  Hematological: Does not bruise/bleed easily.  Psychiatric/Behavioral: Positive for decreased concentration and sleep disturbance. Negative for dysphoric mood. The patient is not nervous/anxious.        Objective:   Physical Exam  Constitutional: He appears well-developed and well-nourished. No distress.  HENT:  Head: Normocephalic and atraumatic.  Right Ear: External ear normal.  Left Ear: External ear normal.  Eyes: Conjunctivae are normal. Pupils are equal, round, and reactive to light.  Cardiovascular: Normal rate, regular rhythm, normal heart sounds and intact distal pulses.  No murmur heard. Pulmonary/Chest: Effort normal and breath sounds normal. No respiratory distress. He has no wheezes. He has no rales. He exhibits no tenderness.  Abdominal: Soft. Bowel sounds are normal.  He exhibits no distension and no mass. There is no tenderness. There is no rebound, no guarding and no CVA tenderness.  Neurological: He is alert.  Skin: Skin is warm and dry. No rash noted. He is not diaphoretic. No erythema. No pallor.  Psychiatric: He has a normal mood and affect. His behavior is normal. Judgment and thought content normal.          Assessment & Plan:   1. Attention deficit hyperactivity disorder (ADHD), unspecified ADHD type   2. Healthcare maintenance   3. General medical exam   4. Screening for diabetes mellitus (DM)   5. Screening cholesterol level   6. Screening for thyroid disorder     Healthcare maintenance Continue excellent water intake and follow heart healthy diet. Decrease-stop tobacco use-YOU CAN DO IT! Please start Adderall 20mg  once daily for two weeks then increase to twice daily. Return in 4 weeks to evaluate medication effectiveness and blood pressure check. We will call you when your lab results are available.  ADHD ADHD Self-Report Scale- 6 marks in Part A Shaded area, numbers: 7,8,9,10,11, 15 He was previously on Concerta, Focalin, Adderall- all at same time  when he went off medications around age 41. Will start on XR dose once daily and increase to BID for continued sx coverage. [place] Controlled Substance Database reviewed-no contraindications noted. Please start Adderall 20mg  once daily for two weeks then increase to twice daily. STOP tobacco use F/u 4 weeks    FOLLOW-UP:  Return in about 4 weeks (around 01/19/2018) for ADHD Medication, Regular Follow Up, Evaluate Medication Effectiveness. 1. Attention deficit hyperactivity disorder (ADHD), unspecified ADHD type   2. Healthcare maintenance   3. General medical exam   4. Screening for diabetes mellitus (DM)   5. Screening cholesterol level   6. Screening for thyroid disorder     Healthcare maintenance Continue excellent water intake and follow heart healthy  diet. Decrease-stop tobacco use-YOU CAN DO IT! Please start Adderall 20mg  once daily for two weeks then increase to twice daily. Return in 4 weeks to evaluate medication effectiveness and blood pressure check. We will call you when your lab results are available.  ADHD ADHD Self-Report Scale- 6 marks in Part A Shaded area, numbers: 7,8,9,10,11, 15 He was previously on Concerta, Focalin, Adderall- all at same time when he went off medications around age 65. Will start on XR dose once daily and increase to BID for continued sx coverage. [place] Controlled Substance Database reviewed-no contraindications noted. Please start Adderall 20mg  once daily for two weeks then increase to twice daily. STOP tobacco use F/u 4 weeks    FOLLOW-UP:  Return in about 4 weeks (around 01/19/2018) for ADHD Medication, Regular Follow Up, Evaluate Medication Effectiveness.

## 2017-12-22 ENCOUNTER — Encounter: Payer: Self-pay | Admitting: Adult Health

## 2017-12-22 ENCOUNTER — Other Ambulatory Visit: Payer: Self-pay | Admitting: Adult Health

## 2017-12-22 ENCOUNTER — Other Ambulatory Visit: Payer: Self-pay

## 2017-12-22 ENCOUNTER — Ambulatory Visit (INDEPENDENT_AMBULATORY_CARE_PROVIDER_SITE_OTHER): Payer: BLUE CROSS/BLUE SHIELD | Admitting: Adult Health

## 2017-12-22 VITALS — BP 109/71 | HR 79 | Wt 226.0 lb

## 2017-12-22 DIAGNOSIS — Z131 Encounter for screening for diabetes mellitus: Secondary | ICD-10-CM

## 2017-12-22 DIAGNOSIS — F909 Attention-deficit hyperactivity disorder, unspecified type: Secondary | ICD-10-CM

## 2017-12-22 DIAGNOSIS — Z0000 Encounter for general adult medical examination without abnormal findings: Secondary | ICD-10-CM

## 2017-12-22 DIAGNOSIS — Z1329 Encounter for screening for other suspected endocrine disorder: Secondary | ICD-10-CM | POA: Diagnosis not present

## 2017-12-22 DIAGNOSIS — Z13220 Encounter for screening for lipoid disorders: Secondary | ICD-10-CM | POA: Diagnosis not present

## 2017-12-22 MED ORDER — AMPHETAMINE-DEXTROAMPHET ER 20 MG PO CP24: ORAL_CAPSULE | ORAL | 0 refills | Status: DC

## 2017-12-22 NOTE — Assessment & Plan Note (Signed)
Continue excellent water intake and follow heart healthy diet. Decrease-stop tobacco use-YOU CAN DO IT! Please start Adderall 20mg  once daily for two weeks then increase to twice daily. Return in 4 weeks to evaluate medication effectiveness and blood pressure check. We will call you when your lab results are available.

## 2017-12-22 NOTE — Addendum Note (Signed)
Addended by: Chalmers CaterUTTLE, Junius Faucett H on: 12/22/2017 09:22 AM   Modules accepted: Orders

## 2017-12-22 NOTE — Patient Instructions (Signed)
Living With Attention Deficit Hyperactivity Disorder If you have been diagnosed with attention deficit hyperactivity disorder (ADHD), you may be relieved that you now know why you have felt or behaved a certain way. Still, you may feel overwhelmed about the treatment ahead. You may also wonder how to get the support you need and how to deal with the condition day-to-day. With treatment and support, you can live with ADHD and manage your symptoms. How to manage lifestyle changes Managing stress Stress is your body's reaction to life changes and events, both good and bad. To cope with the stress of an ADHD diagnosis, it may help to:  Learn more about ADHD.  Exercise regularly. Even a short daily walk can lower stress levels.  Participate in training or education programs (including social skills training classes) that teach you to deal with symptoms.  Medicines Your health care provider may suggest certain medicines if he or she feels that they will help to improve your condition. Stimulant medicines are usually prescribed to treat ADHD, and therapy may also be prescribed. It is important to:  Avoid using alcohol and other substances that may prevent your medicines from working properly (mayinteract).  Talk with your pharmacist or health care provider about all the medicines that you take, their possible side effects, and what medicines are safe to take together.  Make it your goal to take part in all treatment decisions (shared decision-making). Ask about possible side effects of medicines that your health care provider recommends, and tell him or her how you feel about having those side effects. It is best if shared decision-making with your health care provider is part of your total treatment plan.  Relationships To strengthen your relationships with family members while treating your condition, consider taking part in family therapy. You might also attend self-help groups alone or with a  loved one. Be honest about how your symptoms affect your relationships. Make an effort to communicate respectfully instead of fighting, and find ways to show others that you care. Psychotherapy may be useful in helping you cope with how ADHD affects your relationships. How to recognize changes in your condition The following signs may mean that your treatment is working well and your condition is improving:  Consistently being on time for appointments.  Being more organized at home and work.  Other people noticing improvements in your behavior.  Achieving goals that you set for yourself.  Thinking more clearly.  The following signs may mean that your treatment is not working very well:  Feeling impatience or more confusion.  Missing, forgetting, or being late for appointments.  An increasing sense of disorganization and messiness.  More difficulty in reaching goals that you set for yourself.  Loved ones becoming angry or frustrated with you.  Where to find support Talking to others  Keep emotion out of important discussions and speak in a calm, logical way.  Listen closely and patiently to your loved ones. Try to understand their point of view, and try to avoid getting defensive.  Take responsibility for the consequences of your actions.  Ask that others do not take your behaviors personally.  Aim to solve problems as they come up, and express your feelings instead of bottling them up.  Talk openly about what you need from your loved ones and how they can support you.  Consider going to family therapy sessions or having your family meet with a specialist who deals with ADHD-related behavior problems. Finances Not all insurance plans cover   mental health care, so it is important to check with your insurance carrier. If paying for co-pays or counseling services is a problem, search for a local or county mental health care center. Public mental health care services may be  offered there at a low cost or no cost when you are not able to see a private health care provider. If you are taking medicine for ADHD, you may be able to get the generic form, which may be less expensive than brand-name medicine. Some makers of prescription medicines also offer help to patients who cannot afford the medicines that they need. Follow these instructions at home:  Take over-the-counter and prescription medicines only as told by your health care provider. Check with your health care provider before taking any new medicines.  Create structure and an organized atmosphere at home. For example: ? Make a list of tasks, then rank them from most important to least important. Work on one task at a time until your listed tasks are done. ? Make a daily schedule and follow it consistently every day. ? Use an appointment calendar, and check it 2 or 3 times a day to keep on track. Keep it with you when you leave the house. ? Create spaces where you keep certain things, and always put things back in their places after you use them.  Keep all follow-up visits as told by your health care provider. This is important. Questions to ask your health care provider:  What are the risks and benefits of taking medicines?  Would I benefit from therapy?  How often should I follow up with a health care provider? Contact a health care provider if:  You have side effects from your medicines, such as: ? Repeated muscle twitches, coughing, or speech outbursts. ? Sleep problems. ? Loss of appetite. ? Depression. ? New or worsening behavior problems. ? Dizziness. ? Unusually fast heartbeat. ? Stomach pains. ? Headaches. Get help right away if:  You have a severe reaction to a medicine.  Your behavior suddenly gets worse. Summary  With treatment and support, you can live with ADHD and manage your symptoms.  The medicines that are most often prescribed for ADHD are stimulants.  Consider taking  part in family therapy or self-help groups with family members or friends.  When you talk with friends and family about your ADHD, be patient and communicate openly.  Take over-the-counter and prescription medicines only as told by your health care provider. Check with your health care provider before taking any new medicines. This information is not intended to replace advice given to you by your health care provider. Make sure you discuss any questions you have with your health care provider. Document Released: 03/17/2017 Document Revised: 03/17/2017 Document Reviewed: 03/17/2017 Elsevier Interactive Patient Education  2018 [employer].  Amphetamine; Dextroamphetamine tablets What is this medicine? AMPHETAMINE; DEXTROAMPHETAMINE(am FET a meen; dex troe am FET a meen) is used to treat attention-deficit hyperactivity disorder (ADHD). It may also be used for narcolepsy. Federal law prohibits giving this medicine to any person other than the person for whom it was prescribed. Do not share this medicine with anyone else. This medicine may be used for other purposes; ask your health care provider or pharmacist if you have questions. COMMON BRAND NAME(S): Adderall What should I tell my health care provider before I take this medicine? They need to know if you have any of these conditions: -anxiety or panic attacks -circulation problems in fingers and toes -glaucoma -hardening  or blockages of the arteries or heart blood vessels -heart disease or a heart defect -high blood pressure -history of a drug or alcohol abuse problem -history of stroke -kidney disease -liver disease -mental illness -seizures -suicidal thoughts, plans, or attempt; a previous suicide attempt by you or a family member -thyroid disease -Tourette's syndrome -an unusual or allergic reaction to dextroamphetamine, other amphetamines, other medicines, foods, dyes, or preservatives -pregnant or trying to get  pregnant -breast-feeding How should I use this medicine? Take this medicine by mouth with a glass of water. Follow the directions on the prescription label. Take your doses at regular intervals. Do not take your medicine more often than directed. Do not suddenly stop your medicine. You must gradually reduce the dose or you may feel withdrawal effects. Ask your doctor or health care professional for advice. Talk to your pediatrician regarding the use of this medicine in children. Special care may be needed. While this drug may be prescribed for children as young as 3 years for selected conditions, precautions do apply. Overdosage: If you think you have taken too much of this medicine contact a poison control center or emergency room at once. NOTE: This medicine is only for you. Do not share this medicine with others. What if I miss a dose? If you miss a dose, take it as soon as you can. If it is almost time for your next dose, take only that dose. Do not take double or extra doses. What may interact with this medicine? Do not take this medicine with any of the following medications: -MAOIS like Carbex, Eldepryl, Marplan, Nardil, and Parnate -other stimulant medicines for attention disorders, weight loss, or to stay awake This medicine may also interact with the following medications: -acetazolamide -ammonium chloride -antacids -ascorbic acid -atomoxetine -caffeine -certain medicines for blood pressure -certain medicines for depression, anxiety, or psychotic disturbances -certain medicines for seizures like carbamazepine, phenobarbital, phenytoin -certain medicines for stomach problems like cimetidine, famotidine, omeprazole, lansoprazole -cold or allergy medicines -glutamic acid -lithium -meperidine -methenamine; sodium acid phosphate -narcotic medicines for pain -norepinephrine -phenothiazines like chlorpromazine, mesoridazine, prochlorperazine, thioridazine -sodium acid  phosphate -sodium bicarbonate This list may not describe all possible interactions. Give your health care provider a list of all the medicines, herbs, non-prescription drugs, or dietary supplements you use. Also tell them if you smoke, drink alcohol, or use illegal drugs. Some items may interact with your medicine. What should I watch for while using this medicine? Visit your doctor or health care professional for regular checks on your progress. This prescription requires that you follow special procedures with your doctor and pharmacy. You will need to have a new written prescription from your doctor every time you need a refill. This medicine may affect your concentration, or hide signs of tiredness. Until you know how this medicine affects you, do not drive, ride a bicycle, use machinery, or do anything that needs mental alertness. Tell your doctor or health care professional if this medicine loses its effects, or if you feel you need to take more than the prescribed amount. Do not change the dosage without talking to your doctor or health care professional. Decreased appetite is a common side effect when starting this medicine. Eating small, frequent meals or snacks can help. Talk to your doctor if you continue to have poor eating habits. Height and weight growth of a child taking this medicine will be monitored closely. Do not take this medicine close to bedtime. It may prevent you from sleeping. If  you are going to need surgery, a MRI, CT scan, or other procedure, tell your doctor that you are taking this medicine. You may need to stop taking this medicine before the procedure. Tell your doctor or healthcare professional right away if you notice unexplained wounds on your fingers and toes while taking this medicine. You should also tell your healthcare provider if you experience numbness or pain, changes in the skin color, or sensitivity to temperature in your fingers or toes. What side effects may  I notice from receiving this medicine? Side effects that you should report to your doctor or health care professional as soon as possible: -allergic reactions like skin rash, itching or hives, swelling of the face, lips, or tongue -changes in vision -chest pain or chest tightness -confusion, trouble speaking or understanding -fast, irregular heartbeat -fingers or toes feel numb, cool, painful -hallucination, loss of contact with reality -high blood pressure -males: prolonged or painful erection -seizures -severe headaches -shortness of breath -suicidal thoughts or other mood changes -trouble walking, dizziness, loss of balance or coordination -uncontrollable head, mouth, neck, arm, or leg movements Side effects that usually do not require medical attention (report to your doctor or health care professional if they continue or are bothersome): -anxious -headache -loss of appetite -nausea, vomiting -trouble sleeping -weight loss This list may not describe all possible side effects. Call your doctor for medical advice about side effects. You may report side effects to FDA at 1-800-FDA-1088. Where should I keep my medicine? Keep out of the reach of children. This medicine can be abused. Keep your medicine in a safe place to protect it from theft. Do not share this medicine with anyone. Selling or giving away this medicine is dangerous and against the law. Store at room temperature between 15 and 30 degrees C (59 and 86 degrees F). Keep container tightly closed. Throw away any unused medicine after the expiration date. Dispose of properly. This medicine may cause accidental overdose and death if it is taken by other adults, children, or pets. Mix any unused medicine with a substance like cat litter or coffee grounds. Then throw the medicine away in a sealed container like a sealed bag or a coffee can with a lid. Do not use the medicine after the expiration date. NOTE: This sheet is a summary.  It may not cover all possible information. If you have questions about this medicine, talk to your doctor, pharmacist, or health care provider.  2018 Elsevier/Gold Standard (2014-09-18 18:44:41)   Continue excellent water intake and follow heart healthy diet. Decrease-stop tobacco use-YOU CAN DO IT! Please start Adderall 20mg  once daily for two weeks then increase to twice daily. Return in 4 weeks to evaluate medication effectiveness and blood pressure check. We will call you when your lab results are available. NICE TO SEE YOU!

## 2017-12-22 NOTE — Assessment & Plan Note (Addendum)
ADHD Self-Report Scale- 6 marks in Part A Shaded area, numbers: 7,8,9,10,11, 15 He was previously on Concerta, Focalin, Adderall- all at same time when he went off medications around age 32. Will start on XR dose once daily and increase to BID for continued sx coverage. [place] Controlled Substance Database reviewed-no contraindications noted. Please start Adderall 20mg  once daily for two weeks then increase to twice daily. STOP tobacco use F/u 4 weeks

## 2017-12-23 LAB — COMPREHENSIVE METABOLIC PANEL
ALT: 31 IU/L (ref 0–44)
AST: 23 IU/L (ref 0–40)
Albumin/Globulin Ratio: 1.8 (ref 1.2–2.2)
Albumin: 4.7 g/dL (ref 3.5–5.5)
Alkaline Phosphatase: 95 IU/L (ref 39–117)
BUN/Creatinine Ratio: 12 (ref 9–20)
BUN: 10 mg/dL (ref 6–20)
Bilirubin Total: 0.5 mg/dL (ref 0.0–1.2)
CO2: 20 mmol/L (ref 20–29)
Calcium: 9.7 mg/dL (ref 8.7–10.2)
Chloride: 104 mmol/L (ref 96–106)
Creatinine, Ser: 0.85 mg/dL (ref 0.76–1.27)
GFR calc Af Amer: 135 mL/min/{1.73_m2} (ref >59)
GFR calc non Af Amer: 117 mL/min/{1.73_m2} (ref >59)
Globulin, Total: 2.6 g/dL (ref 1.5–4.5)
Glucose: 100 mg/dL — ABNORMAL HIGH (ref 65–99)
Potassium: 4.3 mmol/L (ref 3.5–5.2)
Sodium: 144 mmol/L (ref 134–144)
Total Protein: 7.3 g/dL (ref 6.0–8.5)

## 2017-12-23 LAB — CBC WITH DIFFERENTIAL/PLATELET
Basophils Absolute: 0 10*3/uL (ref 0.0–0.2)
Basos: 1 %
EOS (ABSOLUTE): 0.2 10*3/uL (ref 0.0–0.4)
Eos: 3 %
Hematocrit: 47.8 % (ref 37.5–51.0)
Hemoglobin: 16.7 g/dL (ref 13.0–17.7)
Immature Grans (Abs): 0 10*3/uL (ref 0.0–0.1)
Immature Granulocytes: 0 %
Lymphocytes Absolute: 1.5 10*3/uL (ref 0.7–3.1)
Lymphs: 24 %
MCH: 30.4 pg (ref 26.6–33.0)
MCHC: 34.9 g/dL (ref 31.5–35.7)
MCV: 87 fL (ref 79–97)
Monocytes Absolute: 0.6 10*3/uL (ref 0.1–0.9)
Monocytes: 9 %
Neutrophils Absolute: 4.1 10*3/uL (ref 1.4–7.0)
Neutrophils: 63 %
Platelets: 208 10*3/uL (ref 150–379)
RBC: 5.5 x10E6/uL (ref 4.14–5.80)
RDW: 12.5 % (ref 12.3–15.4)
WBC: 6.3 10*3/uL (ref 3.4–10.8)

## 2017-12-23 LAB — HEMOGLOBIN A1C
Est. average glucose Bld gHb Est-mCnc: 103 mg/dL
Hgb A1c MFr Bld: 5.2 % (ref 4.8–5.6)

## 2017-12-23 LAB — LIPID PANEL
Chol/HDL Ratio: 5.5 ratio — ABNORMAL HIGH (ref 0.0–5.0)
Cholesterol, Total: 192 mg/dL (ref 100–199)
HDL: 35 mg/dL — ABNORMAL LOW (ref >39)
LDL Calculated: 115 mg/dL — ABNORMAL HIGH (ref 0–99)
Triglycerides: 212 mg/dL — ABNORMAL HIGH (ref 0–149)
VLDL Cholesterol Cal: 42 mg/dL — ABNORMAL HIGH (ref 5–40)

## 2017-12-23 LAB — VITAMIN D 25 HYDROXY (VIT D DEFICIENCY, FRACTURES): Vit D, 25-Hydroxy: 14.7 ng/mL — ABNORMAL LOW (ref 30.0–100.0)

## 2017-12-23 LAB — TSH: TSH: 1.63 u[IU]/mL (ref 0.450–4.500)

## 2017-12-25 ENCOUNTER — Other Ambulatory Visit: Payer: Self-pay | Admitting: Adult Health

## 2017-12-25 DIAGNOSIS — E559 Vitamin D deficiency, unspecified: Secondary | ICD-10-CM

## 2017-12-25 MED ORDER — VITAMIN D (ERGOCALCIFEROL) 1.25 MG (50000 UNIT) PO CAPS: 50000.0000 [IU] | ORAL_CAPSULE | ORAL | 0 refills | Status: DC

## 2017-12-28 ENCOUNTER — Telehealth: Payer: Self-pay

## 2017-12-28 NOTE — Telephone Encounter (Signed)
Received notification from [employer] that [facility] for Adderall was approved.  Pt notified.  Tiajuana Amass. Sy Saintjean, CMA

## 2018-01-15 NOTE — Progress Notes (Signed)
Subjective:    Patient ID: Tony Thornton, male    DOB: September 08, 1986, 31 y.o.   MRN: [id number]  HPI:  12/08/17 :[name] is here to establish as a new pt.  He is a pleasant 32 year old male.  PMH:  Tobacco Use- pack/day for 14 years.  Declined smoking cessation today.  His twin sister passed away from suspected prolong QT interval.  She was found deceased at home at age 29.  He underwent full cardiac work-up at [facility]- all negative He experienced palpitations 2016- EMS transport to hospital, work-up negative He denies daily medications and has not been to PCP since his pediatrician 13 years ago. He had cervical neck fx at age 51, surgically repaired with ACDF. He has several issues to discuss today- 1) Malodorous, cloudy urine for the last 3 months.  He is in monogamous relationship for the last 3 months,and denies penile d/c, genital lesions or hx of STIs.  He denies flank pain or blood in urine/stool.  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 2) ED for the last 6 months, difficulty getting/maintaining erection.  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 3) Loud snoring 4) Neck stiffness- present for years and worsens when sleeping. 5) Hx of ADHD treated, last time he was on meds was in highschool. He is currently struggling with poor concentration and inability to focus.  12/22/17 OV: [name] is here for f/u ADHD Self Report Scale Sx Checklist Review He continues to struggle with poor concentration/focus and "wandering mind". He was previously on Concerta, Focalin, Adderall- all at same time when he went off medications around age 56. He denies BP issues, but did have some insomnia when all all three Rx's He denies CP/dyspnea/palpaitions. He continue to smoke pack/day- again declined smoking cessation. He continues to have concentrated, malodorous urine, however only with morning void- resolves throughout the day, he has  increased water intake. He has used Viagra a few times with "much success", denies medication SE.   PHQ Reviewed- denies thoughts of harming himself/others.  01/19/18 OV: Mr. Laviolette is here for f/u ADHD medication.  He was started on Adderall 20mg  IX BID last month and still reports struggling with concentration/focus. He takes medication as 0500, 1200 and denies any insomnia. He also denies CP/dyspnea/palpitations. He has lost 19 lbs since last OV- he has completely eliminated fast food, soda, and caffeine from diet. He estimates to drink > gallon water/day and continues to smoke pack/day- he declined assistance with smoking cessation today.   Patient Care Team    Relationship Specialty Notifications Start End  Julaine Fusi, NP PCP - General Family Medicine  12/08/17     Patient Active Problem List   Diagnosis Date Noted  . Tobacco use disorder 01/19/2018  . Healthcare maintenance 12/08/2017  . Erectile dysfunction 12/08/2017  . ADHD 12/08/2017     Past Medical History:  Diagnosis Date  . ADHD   . H/O cervical fracture      Past Surgical History:  Procedure Laterality Date  . broken neck       Family History  Problem Relation Age of Onset  . Hypertension Father   . Alcohol abuse Father   . Hyperlipidemia Father   . Early death Sister        age 58- unknown cause  . Dementia Maternal Grandmother   . COPD Maternal Grandfather  colon  . Hypertension Paternal Grandmother   . Hyperlipidemia Paternal Grandmother   . COPD Paternal Grandmother   . Hypothyroidism Paternal Grandmother   . Cancer Paternal Grandfather        esophageal     Social History   Substance and Sexual Activity  Drug Use No     Social History   Substance and Sexual Activity  Alcohol Use Yes   Comment: ocassionally     Social History   Tobacco Use  Smoking Status Current Every Day Smoker  . Packs/day: 1.00  . Years: 14.00  . Pack years: 14.00  . Types: Cigarettes   Smokeless Tobacco Former User     Outpatient Encounter Medications as of 01/19/2018  Medication Sig  . amphetamine-dextroamphetamine (ADDERALL XR) 20 MG 24 hr capsule 1 tab each morning for two weeks, then increase to 1 tab twice daily  . Vitamin D, Ergocalciferol, (DRISDOL) 50000 units CAPS capsule Take 1 capsule (50,000 Units total) by mouth every 7 (seven) days.  . [DISCONTINUED] amphetamine-dextroamphetamine (ADDERALL XR) 20 MG 24 hr capsule 1 tab each morning for two weeks, then increase to 1 tab twice daily  . dexmethylphenidate (FOCALIN) 2.5 MG tablet Take 1 tablet (2.5 mg total) by mouth 2 (two) times daily.  . [DISCONTINUED] sildenafil (VIAGRA) 50 MG tablet 1/2 to 1 tablet as needed.  Max daily dose 100mg .   No facility-administered encounter medications on file as of 01/19/2018.     Allergies: Other  Body mass index is 30.7 kg/m.  Blood pressure 121/78, pulse 93, height 5\' 9"  (1.753 m), weight 207 lb 14.4 oz (94.3 kg), SpO2 99 %.    Review of Systems  Constitutional: Positive for fatigue. Negative for activity change, appetite change, chills, diaphoresis, fever and unexpected weight change.  HENT: Negative for congestion.   Eyes: Negative for visual disturbance.  Respiratory: Negative for cough, chest tightness, shortness of breath and wheezing.   Cardiovascular: Negative for chest pain, palpitations and leg swelling.  Gastrointestinal: Negative for abdominal distention, abdominal pain, blood in stool, constipation, diarrhea, nausea and vomiting.  Endocrine: Negative for cold intolerance, heat intolerance, polydipsia, polyphagia and polyuria.  Genitourinary: Negative for decreased urine volume, difficulty urinating, discharge, dysuria, flank pain, frequency, genital sores, hematuria, penile pain, penile swelling, scrotal swelling, testicular pain and urgency.  Musculoskeletal: Positive for arthralgias, back pain, myalgias and neck stiffness. Negative for gait problem,  joint swelling and neck pain.  Skin: Negative for color change, pallor, rash and wound.  Neurological: Negative for dizziness and headaches.  Hematological: Does not bruise/bleed easily.  Psychiatric/Behavioral: Positive for decreased concentration and sleep disturbance. Negative for dysphoric mood. The patient is not nervous/anxious.       Objective:   Physical Exam  Constitutional: He appears well-developed and well-nourished. No distress.  HENT:  Head: Normocephalic and atraumatic.  Right Ear: External ear normal.  Left Ear: External ear normal.  Eyes: Conjunctivae are normal. Pupils are equal, round, and reactive to light.  Cardiovascular: Normal rate, regular rhythm, normal heart sounds and intact distal pulses.  No murmur heard. Pulmonary/Chest: Effort normal and breath sounds normal. No respiratory distress. He has no wheezes. He has no rales. He exhibits no tenderness.  Abdominal: Soft. Bowel sounds are normal. He exhibits no distension and no mass. There is no tenderness. There is no rebound, no guarding and no CVA tenderness.  Neurological: He is alert.  Skin: Skin is warm and dry. No rash noted. He is not diaphoretic. No erythema. No pallor.  Psychiatric: He has a normal mood and affect. His behavior is normal. Judgment and thought content normal.          Assessment & Plan:   1. Attention deficit hyperactivity disorder (ADHD), unspecified ADHD type   2. Healthcare maintenance   3. Tobacco use disorder     [place] Controlled Substance Database reviewed- no contraindications noted. Still struggling with focus/concentration. Denies CP/palpitations/insomnia. Continue adderall 20mg  twice daily and start focalin 2.5mg  twice daily. If you develop any medication side effects, please call clinic. Smoking cessation information provided. ADHD Self Report Scale provided, please complete/bring with you at your follow-up in 2 months.  Healthcare  maintenance Continue excellent water intake and heart healthy diet. Reduce-stop tobacco use- YOU CAN DO IT!  Tobacco use disorder Cessation information provided He declined Nicoderm patch today    FOLLOW-UP:  Return in about 2 months (around 03/19/2018) for Regular Follow Up, ADHD Medication.

## 2018-01-19 ENCOUNTER — Encounter: Payer: Self-pay | Admitting: Adult Health

## 2018-01-19 ENCOUNTER — Ambulatory Visit (INDEPENDENT_AMBULATORY_CARE_PROVIDER_SITE_OTHER): Payer: BLUE CROSS/BLUE SHIELD | Admitting: Adult Health

## 2018-01-19 VITALS — BP 121/78 | HR 93 | Ht 69.0 in | Wt 207.9 lb

## 2018-01-19 DIAGNOSIS — F17200 Nicotine dependence, unspecified, uncomplicated: Secondary | ICD-10-CM

## 2018-01-19 DIAGNOSIS — Z0000 Encounter for general adult medical examination without abnormal findings: Secondary | ICD-10-CM

## 2018-01-19 DIAGNOSIS — F909 Attention-deficit hyperactivity disorder, unspecified type: Secondary | ICD-10-CM | POA: Diagnosis not present

## 2018-01-19 MED ORDER — DEXMETHYLPHENIDATE HCL 2.5 MG PO TABS: 2.5000 mg | ORAL_TABLET | Freq: Two times a day (BID) | ORAL | 0 refills | Status: DC

## 2018-01-19 MED ORDER — AMPHETAMINE-DEXTROAMPHET ER 20 MG PO CP24: ORAL_CAPSULE | ORAL | 0 refills | Status: DC

## 2018-01-19 NOTE — Assessment & Plan Note (Signed)
[place] Controlled Substance Database reviewed- no contraindications noted. Still struggling with focus/concentration. Denies CP/palpitations/insomnia. Continue adderall 20mg  twice daily and start focalin 2.5mg  twice daily. If you develop any medication side effects, please call clinic. Smoking cessation information provided. ADHD Self Report Scale provided, please complete/bring with you at your follow-up in 2 months.

## 2018-01-19 NOTE — Assessment & Plan Note (Signed)
Cessation information provided He declined Nicoderm patch today

## 2018-01-19 NOTE — Patient Instructions (Signed)

## 2018-01-19 NOTE — Assessment & Plan Note (Signed)
Continue excellent water intake and heart healthy diet. Reduce-stop tobacco use- YOU CAN DO IT!

## 2018-01-23 ENCOUNTER — Telehealth: Payer: Self-pay

## 2018-01-23 ENCOUNTER — Other Ambulatory Visit: Payer: Self-pay | Admitting: Adult Health

## 2018-01-23 MED ORDER — DEXMETHYLPHENIDATE HCL 2.5 MG PO TABS: 2.5000 mg | ORAL_TABLET | Freq: Two times a day (BID) | ORAL | 0 refills | Status: DC

## 2018-01-23 NOTE — Telephone Encounter (Signed)
Sent Thank you for checking Database. -Orpha BurKaty

## 2018-01-23 NOTE — Progress Notes (Signed)
[facility] unable to fill Rx sent to [facility]

## 2018-01-23 NOTE — Telephone Encounter (Signed)
Patient called stating that [facility] does not have Focalin 2.5mg  in stock and they will have to order it.  They will not have it until the end of the week and pt is changing pharmacies as well.  Please send new RX to [facility].  Verified with [place] Controlled Substance Database that pt has NOT had the Focalin filled.  Tiajuana Amass. Kristianne Albin, CMA

## 2018-01-23 NOTE — Telephone Encounter (Signed)
Pt informed.  T. Nelson, CMA 

## 2018-03-18 ENCOUNTER — Other Ambulatory Visit: Payer: Self-pay | Admitting: Adult Health

## 2018-03-22 NOTE — Progress Notes (Addendum)
Subjective:    Patient ID: Tony Thornton, male    DOB: [birth year], 32 y.o.   MRN: [id number]  HPI:  12/08/17 :Tony Thornton is here to establish as a new pt.  He is a pleasant 32 year old male.  PMH:  Tobacco Use- pack/day for 14 years.  Declined smoking cessation today.  His twin sister passed away from suspected prolong QT interval.  She was found deceased at home at age 26.  He underwent full cardiac work-up at [facility]- all negative He experienced palpitations 2016- EMS transport to hospital, work-up negative He denies daily medications and has not been to PCP since his pediatrician 13 years ago. He had cervical neck fx at age 37, surgically repaired with ACDF. He has several issues to discuss today- 1) Malodorous, cloudy urine for the last 3 months.  He is in monogamous relationship for the last 3 months,and denies penile d/c, genital lesions or hx of STIs.  He denies flank pain or blood in urine/stool.  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 2) ED for the last 6 months, difficulty getting/maintaining erection.  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 3) Loud snoring 4) Neck stiffness- present for years and worsens when sleeping. 5) Hx of ADHD treated, last time he was on meds was in highschool. He is currently struggling with poor concentration and inability to focus.  12/22/17 OV: Tony Thornton is here for f/u ADHD Self Report Scale Sx Checklist Review He continues to struggle with poor concentration/focus and "wandering mind". He was previously on Concerta, Focalin, Adderall- all at same time when he went off medications around age 35. He denies BP issues, but did have some insomnia when all all three Rx's He denies CP/dyspnea/palpaitions. He continue to smoke pack/day- again declined smoking cessation. He continues to have concentrated, malodorous urine, however only with morning void- resolves throughout the day, he has  increased water intake. He has used Viagra a few times with "much success", denies medication SE.   PHQ Reviewed- denies thoughts of harming himself/others.  01/19/18 OV: Tony Thornton is here for f/u ADHD medication.  He was started on Adderall 20mg  IX BID last month and still reports struggling with concentration/focus. He takes medication as 0500, 1200 and denies any insomnia. He also denies CP/dyspnea/palpitations. He has lost 19 lbs since last OV- he has completely eliminated fast food, soda, and caffeine from diet. He estimates to drink > gallon water/day and continues to smoke pack/day- he declined assistance with smoking cessation today.  03/23/18 OV: Tony Thornton persents for f/u: ADHD and new onset cough with nasal drainage. He report concentration and focus are much improved, however he stopped dexmthylphenidate (Focalin) after two weeks due to SE, "feeling wired and too amped up". He denies CP/palpitations/insomnia. He reports productive cough (yellow mucus), copious thick/yellow nasal drainage, sore throat (3/10) that all started >10 days ago and has been steadily worsening. He denies fever/N/V/D, however reports night sweats that last few evenings.   Patient Care Team    Relationship Specialty Notifications Start End  Julaine Fusi, NP PCP - General Family Medicine  12/08/17     Patient Active Problem List   Diagnosis Date Noted  . Vitamin D deficiency 03/23/2018  . Acute maxillary sinusitis 03/23/2018  . Tobacco use disorder 01/19/2018  . Healthcare maintenance 12/08/2017  . Erectile dysfunction 12/08/2017  . ADHD 12/08/2017     Past  Medical History:  Diagnosis Date  . ADHD   . H/O cervical fracture      Past Surgical History:  Procedure Laterality Date  . broken neck       Family History  Problem Relation Age of Onset  . Hypertension Father   . Alcohol abuse Father   . Hyperlipidemia Father   . Early death Sister        age 31- unknown cause  . Dementia  Maternal Grandmother   . COPD Maternal Grandfather        colon  . Hypertension Paternal Grandmother   . Hyperlipidemia Paternal Grandmother   . COPD Paternal Grandmother   . Hypothyroidism Paternal Grandmother   . Cancer Paternal Grandfather        esophageal     Social History   Substance and Sexual Activity  Drug Use No     Social History   Substance and Sexual Activity  Alcohol Use Yes   Comment: ocassionally     Social History   Tobacco Use  Smoking Status Current Every Day Smoker  . Packs/day: 1.00  . Years: 14.00  . Pack years: 14.00  . Types: Cigarettes  Smokeless Tobacco Former User     Outpatient Encounter Medications as of 03/23/2018  Medication Sig  . amphetamine-dextroamphetamine (ADDERALL XR) 20 MG 24 hr capsule 1 tab twice daily  . Vitamin D, Ergocalciferol, (DRISDOL) 50000 units CAPS capsule Take 1 capsule (50,000 Units total) by mouth every 7 (seven) days.  . [DISCONTINUED] amphetamine-dextroamphetamine (ADDERALL XR) 20 MG 24 hr capsule 1 tab each morning for two weeks, then increase to 1 tab twice daily  . amoxicillin-clavulanate (AUGMENTIN) 875-125 MG tablet Take 1 tablet by mouth 2 (two) times daily.  . [DISCONTINUED] dexmethylphenidate (FOCALIN) 2.5 MG tablet Take 1 tablet (2.5 mg total) by mouth 2 (two) times daily.   No facility-administered encounter medications on file as of 03/23/2018.    Allergies: Other  Body mass index is 28.21 kg/m.  Blood pressure 115/71, pulse 95, height 5\' 9"  (1.753 m), weight 191 lb (86.6 kg), SpO2 98 %.  Review of Systems  Constitutional: Positive for fatigue. Negative for activity change, appetite change, chills, diaphoresis, fever and unexpected weight change.  HENT: Positive for congestion, postnasal drip, rhinorrhea, sinus pressure and sore throat. Negative for sinus pain.   Eyes: Negative for visual disturbance.  Respiratory: Positive for cough and chest tightness. Negative for shortness of breath and  wheezing.   Cardiovascular: Negative for chest pain, palpitations and leg swelling.  Gastrointestinal: Negative for abdominal distention, abdominal pain, blood in stool, constipation, diarrhea, nausea and vomiting.  Endocrine: Negative for cold intolerance, heat intolerance, polydipsia, polyphagia and polyuria.  Genitourinary: Negative for decreased urine volume, difficulty urinating, discharge, dysuria, flank pain, frequency, genital sores, hematuria, penile pain, penile swelling, scrotal swelling, testicular pain and urgency.  Musculoskeletal: Negative for arthralgias, back pain, gait problem, joint swelling, myalgias, neck pain and neck stiffness.  Skin: Negative for color change, pallor, rash and wound.  Neurological: Negative for dizziness and headaches.  Hematological: Does not bruise/bleed easily.  Psychiatric/Behavioral: Negative for decreased concentration, dysphoric mood, hallucinations, self-injury, sleep disturbance and suicidal ideas. The patient is not hyperactive.       Objective:   Physical Exam  Constitutional: He appears well-developed and well-nourished. No distress.  HENT:  Head: Normocephalic and atraumatic.  Right Ear: External ear normal. Tympanic membrane is bulging. Tympanic membrane is not erythematous. No decreased hearing is noted.  Left Ear: External ear  normal. Tympanic membrane is bulging. Tympanic membrane is not erythematous. No decreased hearing is noted.  Nose: Mucosal edema and rhinorrhea present. Right sinus exhibits maxillary sinus tenderness. Right sinus exhibits no frontal sinus tenderness. Left sinus exhibits maxillary sinus tenderness. Left sinus exhibits no frontal sinus tenderness.  Mouth/Throat: Uvula is midline and mucous membranes are normal. Posterior oropharyngeal edema and posterior oropharyngeal erythema present. No oropharyngeal exudate or tonsillar abscesses.  Eyes: Pupils are equal, round, and reactive to light. Conjunctivae are normal.   Neck: Normal range of motion. Neck supple.  Cardiovascular: Normal rate, regular rhythm, normal heart sounds and intact distal pulses.  No murmur heard. Pulmonary/Chest: Effort normal and breath sounds normal. No respiratory distress. He has no wheezes. He has no rales. He exhibits no tenderness.  Abdominal: Soft. Bowel sounds are normal. He exhibits no distension and no mass. There is no tenderness. There is no rebound, no guarding and no CVA tenderness.  Lymphadenopathy:    He has no cervical adenopathy.  Neurological: He is alert.  Skin: Skin is warm and dry. No rash noted. He is not diaphoretic. No erythema. No pallor.  Psychiatric: He has a normal mood and affect. His behavior is normal. Judgment and thought content normal.  Nursing note and vitals reviewed.     Assessment & Plan:   1. Attention deficit hyperactivity disorder (ADHD), unspecified ADHD type   2. Vitamin D deficiency   3. Tobacco use disorder   4. Acute maxillary sinusitis, recurrence not specified     Acute maxillary sinusitis Please use Augmenting 875mg  twice daily, take for 10 days. Increase fluids/rest/vit c-2,000mg /day when not feeling well. Stop tobacco use- you can do it!   ADHD Adult ADHD Self Report Scale-only one answer in shaded section. [place] Controlled Substance Database reviewed- no aberrancies noted. Continue current Adderall regime. Remain off Focalin. Follow-up in 3 months, sooner if needed.  Tobacco use disorder Declined smoking cessation     FOLLOW-UP:  Return in about 3 months (around 06/22/2018) for Regular Follow Up.

## 2018-03-23 ENCOUNTER — Encounter: Payer: Self-pay | Admitting: Adult Health

## 2018-03-23 ENCOUNTER — Ambulatory Visit (INDEPENDENT_AMBULATORY_CARE_PROVIDER_SITE_OTHER): Payer: BLUE CROSS/BLUE SHIELD | Admitting: Adult Health

## 2018-03-23 VITALS — BP 115/71 | HR 95 | Ht 69.0 in | Wt 191.0 lb

## 2018-03-23 DIAGNOSIS — J0100 Acute maxillary sinusitis, unspecified: Secondary | ICD-10-CM

## 2018-03-23 DIAGNOSIS — F909 Attention-deficit hyperactivity disorder, unspecified type: Secondary | ICD-10-CM | POA: Diagnosis not present

## 2018-03-23 DIAGNOSIS — F17200 Nicotine dependence, unspecified, uncomplicated: Secondary | ICD-10-CM | POA: Diagnosis not present

## 2018-03-23 DIAGNOSIS — E559 Vitamin D deficiency, unspecified: Secondary | ICD-10-CM | POA: Diagnosis not present

## 2018-03-23 MED ORDER — AMPHETAMINE-DEXTROAMPHET ER 20 MG PO CP24: ORAL_CAPSULE | ORAL | 0 refills | Status: DC

## 2018-03-23 MED ORDER — AMOXICILLIN-POT CLAVULANATE 875-125 MG PO TABS: 1.0000 | ORAL_TABLET | Freq: Two times a day (BID) | ORAL | 0 refills | Status: DC

## 2018-03-23 NOTE — Assessment & Plan Note (Signed)
Please use Augmenting  twice daily, take for 10 days. Increase fluids/rest/vit c-2,000mg /day when not feeling well. Stop tobacco use- you can do it!

## 2018-03-23 NOTE — Assessment & Plan Note (Signed)
Declined smoking cessation  

## 2018-03-23 NOTE — Patient Instructions (Addendum)
Living With Attention Deficit Hyperactivity Disorder If you have been diagnosed with attention deficit hyperactivity disorder (ADHD), you may be relieved that you now know why you have felt or behaved a certain way. Still, you may feel overwhelmed about the treatment ahead. You may also wonder how to get the support you need and how to deal with the condition day-to-day. With treatment and support, you can live with ADHD and manage your symptoms. How to manage lifestyle changes Managing stress Stress is your body's reaction to life changes and events, both good and bad. To cope with the stress of an ADHD diagnosis, it may help to:  Learn more about ADHD.  Exercise regularly. Even a short daily walk can lower stress levels.  Participate in training or education programs (including social skills training classes) that teach you to deal with symptoms.  Medicines Your health care provider may suggest certain medicines if he or she feels that they will help to improve your condition. Stimulant medicines are usually prescribed to treat ADHD, and therapy may also be prescribed. It is important to:  Avoid using alcohol and other substances that may prevent your medicines from working properly (mayinteract).  Talk with your pharmacist or health care provider about all the medicines that you take, their possible side effects, and what medicines are safe to take together.  Make it your goal to take part in all treatment decisions (shared decision-making). Ask about possible side effects of medicines that your health care provider recommends, and tell him or her how you feel about having those side effects. It is best if shared decision-making with your health care provider is part of your total treatment plan.  Relationships To strengthen your relationships with family members while treating your condition, consider taking part in family therapy. You might also attend self-help groups alone or with a  loved one. Be honest about how your symptoms affect your relationships. Make an effort to communicate respectfully instead of fighting, and find ways to show others that you care. Psychotherapy may be useful in helping you cope with how ADHD affects your relationships. How to recognize changes in your condition The following signs may mean that your treatment is working well and your condition is improving:  Consistently being on time for appointments.  Being more organized at home and work.  Other people noticing improvements in your behavior.  Achieving goals that you set for yourself.  Thinking more clearly.  The following signs may mean that your treatment is not working very well:  Feeling impatience or more confusion.  Missing, forgetting, or being late for appointments.  An increasing sense of disorganization and messiness.  More difficulty in reaching goals that you set for yourself.  Loved ones becoming angry or frustrated with you.  Where to find support Talking to others  Keep emotion out of important discussions and speak in a calm, logical way.  Listen closely and patiently to your loved ones. Try to understand their point of view, and try to avoid getting defensive.  Take responsibility for the consequences of your actions.  Ask that others do not take your behaviors personally.  Aim to solve problems as they come up, and express your feelings instead of bottling them up.  Talk openly about what you need from your loved ones and how they can support you.  Consider going to family therapy sessions or having your family meet with a specialist who deals with ADHD-related behavior problems. Finances Not all insurance plans cover   mental health care, so it is important to check with your insurance carrier. If paying for co-pays or counseling services is a problem, search for a local or county mental health care center. Public mental health care services may be  offered there at a low cost or no cost when you are not able to see a private health care provider. If you are taking medicine for ADHD, you may be able to get the generic form, which may be less expensive than brand-name medicine. Some makers of prescription medicines also offer help to patients who cannot afford the medicines that they need. Follow these instructions at home:  Take over-the-counter and prescription medicines only as told by your health care provider. Check with your health care provider before taking any new medicines.  Create structure and an organized atmosphere at home. For example: ? Make a list of tasks, then rank them from most important to least important. Work on one task at a time until your listed tasks are done. ? Make a daily schedule and follow it consistently every day. ? Use an appointment calendar, and check it 2 or 3 times a day to keep on track. Keep it with you when you leave the house. ? Create spaces where you keep certain things, and always put things back in their places after you use them.  Keep all follow-up visits as told by your health care provider. This is important. Questions to ask your health care provider:  What are the risks and benefits of taking medicines?  Would I benefit from therapy?  How often should I follow up with a health care provider? Contact a health care provider if:  You have side effects from your medicines, such as: ? Repeated muscle twitches, coughing, or speech outbursts. ? Sleep problems. ? Loss of appetite. ? Depression. ? New or worsening behavior problems. ? Dizziness. ? Unusually fast heartbeat. ? Stomach pains. ? Headaches. Get help right away if:  You have a severe reaction to a medicine.  Your behavior suddenly gets worse. Summary  With treatment and support, you can live with ADHD and manage your symptoms.  The medicines that are most often prescribed for ADHD are stimulants.  Consider taking  part in family therapy or self-help groups with family members or friends.  When you talk with friends and family about your ADHD, be patient and communicate openly.  Take over-the-counter and prescription medicines only as told by your health care provider. Check with your health care provider before taking any new medicines. This information is not intended to replace advice given to you by your health care provider. Make sure you discuss any questions you have with your health care provider. Document Released: 03/17/2017 Document Revised: 03/17/2017 Document Reviewed: 03/17/2017 Elsevier Interactive Patient Education  2018 [employer].   Sinusitis, Adult Sinusitis is soreness and inflammation of your sinuses. Sinuses are hollow spaces in the bones around your face. Your sinuses are located:  Around your eyes.  In the middle of your forehead.  Behind your nose.  In your cheekbones.  Your sinuses and nasal passages are lined with a stringy fluid (mucus). Mucus normally drains out of your sinuses. When your nasal tissues become inflamed or swollen, the mucus can become trapped or blocked so air cannot flow through your sinuses. This allows bacteria, viruses, and funguses to grow, which leads to infection. Sinusitis can develop quickly and last for 7?10 days (acute) or for more than 12 weeks (chronic). Sinusitis often develops  after a cold. What are the causes? This condition is caused by anything that creates swelling in the sinuses or stops mucus from draining, including:  Allergies.  Asthma.  Bacterial or viral infection.  Abnormally shaped bones between the nasal passages.  Nasal growths that contain mucus (nasal polyps).  Narrow sinus openings.  Pollutants, such as chemicals or irritants in the air.  A foreign object stuck in the nose.  A fungal infection. This is rare.  What increases the risk? The following factors may make you more likely to develop this  condition:  Having allergies or asthma.  Having had a recent cold or respiratory tract infection.  Having structural deformities or blockages in your nose or sinuses.  Having a weak immune system.  Doing a lot of swimming or diving.  Overusing nasal sprays.  Smoking.  What are the signs or symptoms? The main symptoms of this condition are pain and a feeling of pressure around the affected sinuses. Other symptoms include:  Upper toothache.  Earache.  Headache.  Bad breath.  Decreased sense of smell and taste.  A cough that may get worse at night.  Fatigue.  Fever.  Thick drainage from your nose. The drainage is often green and it may contain pus (purulent).  Stuffy nose or congestion.  Postnasal drip. This is when extra mucus collects in the throat or back of the nose.  Swelling and warmth over the affected sinuses.  Sore throat.  Sensitivity to light.  How is this diagnosed? This condition is diagnosed based on symptoms, a medical history, and a physical exam. To find out if your condition is acute or chronic, your health care provider may:  Look in your nose for signs of nasal polyps.  Tap over the affected sinus to check for signs of infection.  View the inside of your sinuses using an imaging device that has a light attached (endoscope).  If your health care provider suspects that you have chronic sinusitis, you may also:  Be tested for allergies.  Have a sample of mucus taken from your nose (nasal culture) and checked for bacteria.  Have a mucus sample examined to see if your sinusitis is related to an allergy.  If your sinusitis does not respond to treatment and it lasts longer than 8 weeks, you may have an MRI or CT scan to check your sinuses. These scans also help to determine how severe your infection is. In rare cases, a bone biopsy may be done to rule out more serious types of fungal sinus disease. How is this treated? Treatment for  sinusitis depends on the cause and whether your condition is chronic or acute. If a virus is causing your sinusitis, your symptoms will go away on their own within 10 days. You may be given medicines to relieve your symptoms, including:  Topical nasal decongestants. They shrink swollen nasal passages and let mucus drain from your sinuses.  Antihistamines. These drugs block inflammation that is triggered by allergies. This can help to ease swelling in your nose and sinuses.  Topical nasal corticosteroids. These are nasal sprays that ease inflammation and swelling in your nose and sinuses.  Nasal saline washes. These rinses can help to get rid of thick mucus in your nose.  If your condition is caused by bacteria, you will be given an antibiotic medicine. If your condition is caused by a fungus, you will be given an antifungal medicine. Surgery may be needed to correct underlying conditions, such as narrow nasal  passages. Surgery may also be needed to remove polyps. Follow these instructions at home: Medicines  Take, use, or apply over-the-counter and prescription medicines only as told by your health care provider. These may include nasal sprays.  If you were prescribed an antibiotic medicine, take it as told by your health care provider. Do not stop taking the antibiotic even if you start to feel better. Hydrate and Humidify  Drink enough water to keep your urine clear or pale yellow. Staying hydrated will help to thin your mucus.  Use a cool mist humidifier to keep the humidity level in your home above 50%.  Inhale steam for 10-15 minutes, 3-4 times a day or as told by your health care provider. You can do this in the bathroom while a hot shower is running.  Limit your exposure to cool or dry air. Rest  Rest as much as possible.  Sleep with your head raised (elevated).  Make sure to get enough sleep each night. General instructions  Apply a warm, moist washcloth to your face 3-4  times a day or as told by your health care provider. This will help with discomfort.  Wash your hands often with soap and water to reduce your exposure to viruses and other germs. If soap and water are not available, use hand sanitizer.  Do not smoke. Avoid being around people who are smoking (secondhand smoke).  Keep all follow-up visits as told by your health care provider. This is important. Contact a health care provider if:  You have a fever.  Your symptoms get worse.  Your symptoms do not improve within 10 days. Get help right away if:  You have a severe headache.  You have persistent vomiting.  You have pain or swelling around your face or eyes.  You have vision problems.  You develop confusion.  Your neck is stiff.  You have trouble breathing. This information is not intended to replace advice given to you by your health care provider. Make sure you discuss any questions you have with your health care provider. Document Released: 11/15/2005 Document Revised: 07/11/2016 Document Reviewed: 09/10/2015 Elsevier Interactive Patient Education  2018 [employer].  Continue current Adderall regime. Remain off Focalin. Please use Augmenting 875mg  twice daily, take for 10 days. Increase fluids/rest/vit c-2,000mg /day when not feeling well. Stop tobacco use- you can do it! Follow-up in 3 months, sooner if needed. NICE TO SEE YOU!

## 2018-03-23 NOTE — Assessment & Plan Note (Signed)
Adult ADHD Self Report Scale-only one answer in shaded section. [place] Controlled Substance Database reviewed- no aberrancies noted. Continue current Adderall regime. Remain off Focalin. Follow-up in 3 months, sooner if needed.

## 2018-03-24 LAB — VITAMIN D 25 HYDROXY (VIT D DEFICIENCY, FRACTURES): Vit D, 25-Hydroxy: 66.5 ng/mL (ref 30.0–100.0)

## 2018-03-27 ENCOUNTER — Telehealth: Payer: Self-pay | Admitting: Adult Health

## 2018-03-27 NOTE — Telephone Encounter (Signed)
See results note.  Tony Thornton, CMA 

## 2018-03-27 NOTE — Telephone Encounter (Signed)
Patient called for lab results.

## 2018-06-03 ENCOUNTER — Encounter: Payer: Self-pay | Admitting: Adult Health

## 2018-06-05 ENCOUNTER — Ambulatory Visit (INDEPENDENT_AMBULATORY_CARE_PROVIDER_SITE_OTHER): Payer: BLUE CROSS/BLUE SHIELD | Admitting: Adult Health

## 2018-06-05 ENCOUNTER — Encounter: Payer: Self-pay | Admitting: Adult Health

## 2018-06-05 VITALS — BP 112/63 | HR 90 | Ht 69.0 in | Wt 184.6 lb

## 2018-06-05 DIAGNOSIS — F909 Attention-deficit hyperactivity disorder, unspecified type: Secondary | ICD-10-CM

## 2018-06-05 DIAGNOSIS — L299 Pruritus, unspecified: Secondary | ICD-10-CM | POA: Insufficient documentation

## 2018-06-05 MED ORDER — HYDROXYZINE HCL 10 MG PO TABS: 10.0000 mg | ORAL_TABLET | Freq: Every evening | ORAL | 0 refills | Status: DC | PRN

## 2018-06-05 MED ORDER — PREDNISONE 10 MG (21) PO TBPK: ORAL_TABLET | ORAL | 0 refills | Status: DC

## 2018-06-05 NOTE — Patient Instructions (Signed)
Please take Prednisone dose pak as directed and Hydroxyzine 10mg  as needed as night. Recommend OTC Antihistamine in morning, ie Claritin, Zyrtec. Recommend OTC Hydrocortisone cream as needed. If itching/bumps remain after dose pak, please call and we will refer you to dermatology. FEEL BETTER!

## 2018-06-05 NOTE — Assessment & Plan Note (Signed)
Please take Prednisone dose pak as directed and Hydroxyzine 10mg  as needed as night. Recommend OTC Antihistamine in morning, ie Claritin, Zyrtec. Recommend OTC Hydrocortisone cream as needed. If itching/bumps remain after dose pak, please call and we will refer you to dermatology.

## 2018-06-05 NOTE — Progress Notes (Signed)
Subjective:    Patient ID: Tony Thornton, male    DOB: [birth year], 32 y.o.   MRN: [id number]  HPI:  Tony Thornton presents with diffuse "itching and bumps" on his bil upper/lower extremities and anterior chest that began >3 weeks ago. He denies starting new medication, he has stopped his ADHD medication last week while on vacation- [place]. He reports constant itching that "seems worsen at night". He denies travel outside [place]. He denies change in diet. He denies fever/night sweats/joint pain. He denies CP/dyspnea/wheexing/HA/tongue swelling. He has taken his GFs Hydroxyzine which did provide some sx relief. He has not tried hydrocortisone cream. He continues to smoke pack/day. His GF is at [facility] and she reports same diffuse itching and has been using Hydroxyzine with some sx relief as well. 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   Patient Care Team    Relationship Specialty Notifications Start End  Julaine Fusi, NP PCP - General Family Medicine  12/08/17     Patient Active Problem List   Diagnosis Date Noted  . Pruritus 06/05/2018  . Vitamin D deficiency 03/23/2018  . Acute maxillary sinusitis 03/23/2018  . Tobacco use disorder 01/19/2018  . Healthcare maintenance 12/08/2017  . Erectile dysfunction 12/08/2017  . ADHD 12/08/2017     Past Medical History:  Diagnosis Date  . ADHD   . H/O cervical fracture      Past Surgical History:  Procedure Laterality Date  . broken neck       Family History  Problem Relation Age of Onset  . Hypertension Father   . Alcohol abuse Father   . Hyperlipidemia Father   . Early death Sister        age 57- unknown cause  . Dementia Maternal Grandmother   . COPD Maternal Grandfather        colon  . Hypertension Paternal Grandmother   . Hyperlipidemia Paternal Grandmother   . COPD Paternal Grandmother   . Hypothyroidism Paternal Grandmother   . Cancer Paternal Grandfather        esophageal     Social History   Substance and Sexual  Activity  Drug Use No     Social History   Substance and Sexual Activity  Alcohol Use Yes   Comment: ocassionally     Social History   Tobacco Use  Smoking Status Current Every Day Smoker  . Packs/day: 1.00  . Years: 14.00  . Pack years: 14.00  . Types: Cigarettes  Smokeless Tobacco Former User     Outpatient Encounter Medications as of 06/05/2018  Medication Sig  . amphetamine-dextroamphetamine (ADDERALL XR) 20 MG 24 hr capsule 1 tab twice daily  . hydrOXYzine (ATARAX/VISTARIL) 10 MG tablet Take 1 tablet (10 mg total) by mouth at bedtime as needed for itching.  . predniSONE (STERAPRED UNI-PAK 21 TAB) 10 MG (21) TBPK tablet Use as directed  . [DISCONTINUED] amoxicillin-clavulanate (AUGMENTIN) 875-125 MG tablet Take 1 tablet by mouth 2 (two) times daily.  . [DISCONTINUED] Vitamin D, Ergocalciferol, (DRISDOL) 50000 units CAPS capsule Take 1 capsule (50,000 Units total) by mouth every 7 (seven) days.   No facility-administered encounter medications on file as of 06/05/2018.     Allergies: Other  Body mass index is 27.26 kg/m.  Blood pressure 112/63, pulse 90, height 5\' 9"  (1.753 m), weight 184 lb 9.6 oz (83.7 kg), SpO2 98 %.  Review of Systems  Constitutional: Negative for activity change, appetite change, chills, diaphoresis, fatigue, fever and unexpected weight change.  Respiratory: Negative  for cough, chest tightness, shortness of breath, wheezing and stridor.   Cardiovascular: Negative for chest pain, palpitations and leg swelling.  Endocrine: Negative for cold intolerance, heat intolerance, polydipsia, polyphagia and polyuria.  Skin: Positive for color change and rash. Negative for pallor and wound.  Neurological: Negative for dizziness and headaches.  Hematological: Does not bruise/bleed easily.  Psychiatric/Behavioral: Positive for sleep disturbance.      Objective:   Physical Exam  Constitutional: He is oriented to person, place, and time. He appears  well-developed and well-nourished. No distress.  HENT:  Head: Normocephalic and atraumatic.  Right Ear: External ear normal.  Left Ear: External ear normal.  Mouth/Throat: Oropharynx is clear and moist.  Eyes: Pupils are equal, round, and reactive to light. EOM are normal.  Cardiovascular: Normal rate, regular rhythm, normal heart sounds and intact distal pulses.  No murmur heard. Pulmonary/Chest: Effort normal and breath sounds normal. No stridor. No respiratory distress. He has no wheezes. He has no rales. He exhibits no tenderness.  Neurological: He is alert and oriented to person, place, and time.  Skin: Skin is warm and dry. Capillary refill takes less than 2 seconds. No purpura and no rash noted. Rash is not urticarial. He is not diaphoretic. No erythema. No pallor.  Diffuse very small, non fluid bumps noted in bil arm/legs/anterior 1/3 chest No open tissue/drainage/warmth/erythema noted  Psychiatric: He has a normal mood and affect. His behavior is normal. Judgment and thought content normal.  Nursing note and vitals reviewed.      Assessment & Plan:   1. Pruritus   2. Attention deficit hyperactivity disorder (ADHD), unspecified ADHD type     Pruritus Please take Prednisone dose pak as directed and Hydroxyzine 10mg  as needed as night. Recommend OTC Antihistamine in morning, ie Claritin, Zyrtec. Recommend OTC Hydrocortisone cream as needed. If itching/bumps remain after dose pak, please call and we will refer you to dermatology.  ADHD Pt reports going off medication while on vacation He reports he has not re-started Rx  FOLLOW-UP:  Return if symptoms worsen or fail to improve.

## 2018-06-05 NOTE — Assessment & Plan Note (Addendum)
Pt reports going off medication while on vacation He reports he has not re-started Rx

## 2018-06-19 DIAGNOSIS — B349 Viral infection, unspecified: Secondary | ICD-10-CM | POA: Diagnosis not present

## 2018-06-20 ENCOUNTER — Ambulatory Visit: Payer: Self-pay | Admitting: Adult Health

## 2018-06-20 NOTE — Progress Notes (Addendum)
Subjective:    Patient ID: Tony Thornton, male    DOB: 1986-01-12, 32 y.o.   MRN: [id number]  HPI:  [name] is here for regular f/u: ADHD and acute illness He also reports fever (highest), chills, sweats, generalized myalgia's, non-productive cough, neck pain, poor appetite, and intermittent HA, that began Sunday afternoon. He reports fever has been as high as 103, he has been alternating OTC Acetaminophen and Ibuprofen- last dose of Acetaminophen was 0230- currently 98.4 oral temp He was seen Monday night at [facility]- UA was neg- he was instructed to increase water intake and use OTC anti-pyretics He reports mild nausea without vomiting Monday and Tuesday He reports loose stools on Monday and Tuesday He denies urinary frequency, urgency, but does report dysuria He estimates to only be drinking 15-20 oz water/day He has only smoked one cigarette in the last 72 hours-GREAT! He reports "pulling off small tick off my lower stomach about 2nd of June" He reports that the tick was flat and estimates that it was attached <24 hrs He denies recent travel outside [place] He denies being around anyone else acutely ill He denies CP/dysnea at rest/palpitations/dizziness  He reports improved focus/concentration with Adderrall He denies "jitters" or insomnia  Patient Care Team    Relationship Specialty Notifications Start End  Julaine Fusi, NP PCP - General Family Medicine  12/08/17     Patient Active Problem List   Diagnosis Date Noted  . Fever and chills 06/21/2018  . Cough in adult 06/21/2018  . Myalgia 06/21/2018  . Dysuria 06/21/2018  . Pruritus 06/05/2018  . Vitamin D deficiency 03/23/2018  . Acute maxillary sinusitis 03/23/2018  . Tobacco use disorder 01/19/2018  . Healthcare maintenance 12/08/2017  . Erectile dysfunction 12/08/2017  . ADHD 12/08/2017     Past Medical History:  Diagnosis Date  . ADHD   . H/O cervical fracture      Past Surgical History:  Procedure  Laterality Date  . broken neck       Family History  Problem Relation Age of Onset  . Hypertension Father   . Alcohol abuse Father   . Hyperlipidemia Father   . Early death Sister        age 54- unknown cause  . Dementia Maternal Grandmother   . COPD Maternal Grandfather        colon  . Hypertension Paternal Grandmother   . Hyperlipidemia Paternal Grandmother   . COPD Paternal Grandmother   . Hypothyroidism Paternal Grandmother   . Cancer Paternal Grandfather        esophageal     Social History   Substance and Sexual Activity  Drug Use No     Social History   Substance and Sexual Activity  Alcohol Use Yes   Comment: ocassionally     Social History   Tobacco Use  Smoking Status Current Every Day Smoker  . Packs/day: 1.00  . Years: 14.00  . Pack years: 14.00  . Types: Cigarettes  Smokeless Tobacco Former User     Outpatient Encounter Medications as of 06/21/2018  Medication Sig  . amphetamine-dextroamphetamine (ADDERALL XR) 20 MG 24 hr capsule 1 tab twice daily  . [DISCONTINUED] amphetamine-dextroamphetamine (ADDERALL XR) 20 MG 24 hr capsule 1 tab twice daily  . [DISCONTINUED] hydrOXYzine (ATARAX/VISTARIL) 10 MG tablet Take 1 tablet (10 mg total) by mouth at bedtime as needed for itching.  . [DISCONTINUED] predniSONE (STERAPRED UNI-PAK 21 TAB) 10 MG (21) TBPK tablet Use as directed  No facility-administered encounter medications on file as of 06/21/2018.     Allergies: Other  Body mass index is 27.64 kg/m.  Blood pressure 110/72, pulse 96, temperature 98.4 F (36.9 C), temperature source Oral, height 5\' 9"  (1.753 m), weight 187 lb 3.2 oz (84.9 kg), SpO2 98 %.  Review of Systems  Constitutional: Positive for activity change, appetite change, chills, fatigue and fever. Negative for diaphoresis and unexpected weight change.  HENT: Negative for congestion, postnasal drip, sinus pressure, sinus pain, trouble swallowing and voice change.   Eyes:  Negative for visual disturbance.  Respiratory: Positive for cough. Negative for chest tightness, shortness of breath, wheezing and stridor.   Cardiovascular: Negative for chest pain, palpitations and leg swelling.  Gastrointestinal: Positive for diarrhea and nausea. Negative for abdominal distention, abdominal pain, blood in stool, constipation and vomiting.  Genitourinary: Positive for dysuria and flank pain. Negative for difficulty urinating, frequency and urgency.  Musculoskeletal: Positive for myalgias.  Neurological: Positive for headaches. Negative for dizziness and numbness.  Hematological: Does not bruise/bleed easily.  Psychiatric/Behavioral: Positive for sleep disturbance. Negative for confusion, decreased concentration, dysphoric mood, hallucinations, self-injury and suicidal ideas. The patient is not nervous/anxious and is not hyperactive.        Objective:   Physical Exam  Constitutional: He is oriented to person, place, and time. He appears well-developed and well-nourished. No distress.  HENT:  Head: Normocephalic.  Right Ear: External ear normal. Tympanic membrane is not perforated and not bulging. No decreased hearing is noted.  Left Ear: External ear normal. Tympanic membrane is not perforated and not bulging. No decreased hearing is noted.  Nose: No mucosal edema or rhinorrhea. Right sinus exhibits no maxillary sinus tenderness and no frontal sinus tenderness. Left sinus exhibits no maxillary sinus tenderness and no frontal sinus tenderness.  Mouth/Throat: Uvula is midline and oropharynx is clear and moist. Mucous membranes are not pale, not dry and not cyanotic. No oropharyngeal exudate, posterior oropharyngeal edema, posterior oropharyngeal erythema or tonsillar abscesses.  Eyes: Pupils are equal, round, and reactive to light. Conjunctivae and EOM are normal.  Neck: Normal range of motion. Neck supple.  Cardiovascular: Normal rate, regular rhythm, normal heart sounds and  intact distal pulses.  No murmur heard. Pulmonary/Chest: Effort normal and breath sounds normal. No stridor. No respiratory distress. He has no wheezes. He has no rales. He exhibits no tenderness.  Abdominal: Soft. Bowel sounds are normal. He exhibits no distension and no mass. There is no tenderness. There is no rebound and no guarding. No hernia.  Musculoskeletal:       Cervical back: He exhibits no tenderness.  Kernig's Sign- neg Brudzinski's Sign- neg  Lymphadenopathy:    He has no cervical adenopathy.  Neurological: He is alert and oriented to person, place, and time. Coordination normal.  Skin: Skin is warm and dry. Capillary refill takes less than 2 seconds. No rash noted. No erythema. No pallor.  Psychiatric: He has a normal mood and affect. His behavior is normal. Judgment and thought content normal.  Nursing note and vitals reviewed.     Assessment & Plan:   1. Fever and chills   2. Erectile dysfunction, unspecified erectile dysfunction type   3. Attention deficit hyperactivity disorder (ADHD), unspecified ADHD type   4. Myalgia   5. Cough in adult   6. Healthcare maintenance   7. Dysuria     Erectile dysfunction Likely r/t to stress, lack of sleep Recommend stress reduction techniques/stop smoking Do not recommend long term use  of Viagra  ADHD [place] Controlled Substance Database reviewed- no aberrancies noted Adderall XR 20mg  BID refilled   Cough in adult CXR- Stop tobacco use   Healthcare maintenance Recommend stress reduction techniques, practice good sleep hygiene. Regular follow-up 3 months.  Fever and chills Urinalysis- negative for UTI Chest Xray-stable We will call you when lab results are available. Push fluids/rest/vit c-2,000mg /day Continue to alternate OTC Acetaminophen and Ibuprofen for muscle aches/pain as needed. Recommend OTC Zyrtec per manufacturer's instructions to reduce generalized itching. Work Excuse provided, okay to return  Friday 06/23/18  Myalgia Urinalysis- negative for UTI Chest Xray-stable CBC, Lyme IgM with reflex drawn Push fluids/rest/vit c-2,000mg /day Continue to alternate OTC Acetaminophen and Ibuprofen for muscle aches/pain as needed. Recommend OTC Zyrtec per manufacturer's instructions to reduce generalized itching. Work Excuse provided, okay to return Friday 06/23/18  Dysuria UA- neg for UTI  FOLLOW-UP:  Return in about 3 months (around 09/21/2018) for Regular Follow Up, ADHD Medication.

## 2018-06-21 ENCOUNTER — Encounter: Payer: Self-pay | Admitting: Adult Health

## 2018-06-21 ENCOUNTER — Ambulatory Visit (INDEPENDENT_AMBULATORY_CARE_PROVIDER_SITE_OTHER): Payer: BLUE CROSS/BLUE SHIELD | Admitting: Adult Health

## 2018-06-21 ENCOUNTER — Ambulatory Visit: Payer: BLUE CROSS/BLUE SHIELD

## 2018-06-21 VITALS — BP 110/72 | HR 96 | Temp 98.4°F | Ht 69.0 in | Wt 187.2 lb

## 2018-06-21 DIAGNOSIS — R05 Cough: Secondary | ICD-10-CM

## 2018-06-21 DIAGNOSIS — N529 Male erectile dysfunction, unspecified: Secondary | ICD-10-CM

## 2018-06-21 DIAGNOSIS — F909 Attention-deficit hyperactivity disorder, unspecified type: Secondary | ICD-10-CM

## 2018-06-21 DIAGNOSIS — M791 Myalgia, unspecified site: Secondary | ICD-10-CM | POA: Diagnosis not present

## 2018-06-21 DIAGNOSIS — R300 Dysuria: Secondary | ICD-10-CM

## 2018-06-21 DIAGNOSIS — R059 Cough, unspecified: Secondary | ICD-10-CM

## 2018-06-21 DIAGNOSIS — R509 Fever, unspecified: Secondary | ICD-10-CM

## 2018-06-21 DIAGNOSIS — Z0000 Encounter for general adult medical examination without abnormal findings: Secondary | ICD-10-CM

## 2018-06-21 LAB — POCT URINALYSIS DIPSTICK
Bilirubin, UA: NEGATIVE
Glucose, UA: NEGATIVE
Ketones, UA: NEGATIVE
Leukocytes, UA: NEGATIVE
Nitrite, UA: NEGATIVE
Protein, UA: POSITIVE — AB
Spec Grav, UA: 1.02 (ref 1.010–1.025)
Urobilinogen, UA: 0.2 E.U./dL
pH, UA: 7 (ref 5.0–8.0)

## 2018-06-21 MED ORDER — AMPHETAMINE-DEXTROAMPHET ER 20 MG PO CP24: ORAL_CAPSULE | ORAL | 0 refills | Status: DC

## 2018-06-21 NOTE — Patient Instructions (Addendum)
Fever, Adult A fever is an increase in the body's temperature. It is usually defined as a temperature of 100F (38C) or higher. Brief mild or moderate fevers generally have no long-term effects, and they often do not require treatment. Moderate or high fevers may make you feel uncomfortable and can sometimes be a sign of a serious illness or disease. The sweating that may occur with repeated or prolonged fever may also cause dehydration. Fever is confirmed by taking a temperature with a thermometer. A measured temperature can vary with:  Age.  Time of day.  Location of the thermometer: ? Mouth (oral). ? Rectum (rectal). ? Ear (tympanic). ? Underarm (axillary). ? Forehead (temporal).  Follow these instructions at home: Pay attention to any changes in your symptoms. Take these actions to help with your condition:  Take over-the counter and prescription medicines only as told by your health care provider. Follow the dosing instructions carefully.  If you were prescribed an antibiotic medicine, take it as told by your health care provider. Do not stop taking the antibiotic even if you start to feel better.  Rest as needed.  Drink enough fluid to keep your urine clear or pale yellow. This helps to prevent dehydration.  Sponge yourself or bathe with room-temperature water to help reduce your body temperature as needed. Do not use ice water.  Do not overbundle yourself in blankets or heavy clothes.  Contact a health care provider if:  You vomit.  You cannot eat or drink without vomiting.  You have diarrhea.  You have pain when you urinate.  Your symptoms do not improve with treatment.  You develop new symptoms.  You develop excessive weakness. Get help right away if:  You have shortness of breath or have trouble breathing.  You are dizzy or you faint.  You are disoriented or confused.  You develop signs of dehydration, such as a dry mouth, decreased urination, or  paleness.  You develop severe pain in your abdomen.  You have persistent vomiting or diarrhea.  You develop a skin rash.  Your symptoms suddenly get worse. This information is not intended to replace advice given to you by your health care provider. Make sure you discuss any questions you have with your health care provider. Document Released: 05/11/2001 Document Revised: 04/22/2016 Document Reviewed: 01/09/2015 Elsevier Interactive Patient Education  2018 [employer]  Mindfulness-Based Stress Reduction Mindfulness-based stress reduction (MBSR) is a program that helps people learn to practice mindfulness. Mindfulness is the practice of intentionally paying attention to the present moment. It can be learned and practiced through techniques such as education, breathing exercises, meditation, and yoga. MBSR includes several mindfulness techniques in one program. MBSR works best when you understand the treatment, are willing to try new things, and can commit to spending time practicing what you learn. MBSR training may include learning about:  How your emotions, thoughts, and reactions affect your body.  New ways to respond to things that cause negative thoughts to start (triggers).  How to notice your thoughts and let go of them.  Practicing awareness of everyday things that you normally do without thinking.  The techniques and goals of different types of meditation.  What are the benefits of MBSR? MBSR can have many benefits, which include helping you to:  Develop self-awareness. This refers to knowing and understanding yourself.  Learn skills and attitudes that help you to participate in your own health care.  Learn new ways to care for yourself.  Be more accepting  about how things are, and let things go.  Be less judgmental and approach things with an open mind.  Be patient with yourself and trust yourself more.  MBSR has also been shown to:  Reduce negative emotions,  such as depression and anxiety.  Improve memory and focus.  Change how you sense and approach pain.  Boost your body's ability to fight infections.  Help you connect better with other people.  Improve your sense of well-being.  Follow these instructions at home:  Find a local in-person or online MBSR program.  Set aside some time regularly for mindfulness practice.  Find a mindfulness practice that works best for you. This may include one or more of the following: ? Meditation. Meditation involves focusing your mind on a certain thought or activity. ? Breathing awareness exercises. These help you to stay present by focusing on your breath. ? Body scan. For this practice, you lie down and pay attention to each part of your body from head to toe. You can identify tension and soreness and intentionally relax parts of your body. ? Yoga. Yoga involves stretching and breathing, and it can improve your ability to move and be flexible. It can also provide an experience of testing your body's limits, which can help you release stress. ? Mindful eating. This way of eating involves focusing on the taste, texture, color, and smell of each bite of food. Because this slows down eating and helps you feel full sooner, it can be an important part of a weight-loss plan.  Find a podcast or recording that provides guidance for breathing awareness, body scan, or meditation exercises. You can listen to these any time when you have a free moment to rest without distractions.  Follow your treatment plan as told by your health care provider. This may include taking regular medicines and making changes to your diet or lifestyle as recommended. How to practice mindfulness To do a basic awareness exercise:  Find a comfortable place to sit.  Pay attention to the present moment. Observe your thoughts, feelings, and surroundings just as they are.  Avoid placing judgment on yourself, your feelings, or your  surroundings. Make note of any judgment that comes up, and let it go.  Your mind may wander, and that is okay. Make note of when your thoughts drift, and return your attention to the present moment.  To do basic mindfulness meditation:  Find a comfortable place to sit. This may include a stable chair or a firm floor cushion. ? Sit upright with your back straight. Let your arms fall next to your side with your hands resting on your legs. ? If sitting in a chair, rest your feet flat on the floor. ? If sitting on a cushion, cross your legs in front of you.  Keep your head in a neutral position with your chin dropped slightly. Relax your jaw and rest the tip of your tongue on the roof of your mouth. Drop your gaze to the floor. You can close your eyes if you like.  Breathe normally and pay attention to your breath. Feel the air moving in and out of your nose. Feel your belly expanding and relaxing with each breath.  Your mind may wander, and that is okay. Make note of when your thoughts drift, and return your attention to your breath.  Avoid placing judgment on yourself, your feelings, or your surroundings. Make note of any judgment or feelings that come up, let them go, and bring your  attention back to your breath.  When you are ready, lift your gaze or open your eyes. Pay attention to how your body feels after the meditation.  Where to find more information: You can find more information about MBSR from:  Your health care provider.  Community-based meditation centers or programs.  Programs offered near you.  Summary  Mindfulness-based stress reduction (MBSR) is a program that teaches you how to intentionally pay attention to the present moment. It is used with other treatments to help you cope better with daily stress, emotions, and pain.  MBSR focuses on developing self-awareness, which allows you to respond to life stress without judgment or negative emotions.  MBSR programs may  involve learning different mindfulness practices, such as breathing exercises, meditation, yoga, body scan, or mindful eating. Find a mindfulness practice that works best for you, and set aside time for it on a regular basis. This information is not intended to replace advice given to you by your health care provider. Make sure you discuss any questions you have with your health care provider. Document Released: 03/24/2017 Document Revised: 03/24/2017 Document Reviewed: 03/24/2017 Elsevier Interactive Patient Education  2018 [employer].   Urinalysis- negative for UTI Chest Xray-stable We will call you when lab results are available. Adderall XR 20mg  refilled. Push fluids/rest/vit c-2,000mg /day Continue to alternate OTC Acetaminophen and Ibuprofen for muscle aches/pain as needed. Recommend OTC Zyrtec per manufacturer's instructions to reduce generalized itching. Work Excuse provided, okay to return Friday 06/23/18 Recommend stress reduction techniques, practice good sleep hygiene. Regular follow-up 3 months. FEEL BETTER!

## 2018-06-21 NOTE — Assessment & Plan Note (Signed)
CXR- Stop tobacco use

## 2018-06-21 NOTE — Assessment & Plan Note (Addendum)
Urinalysis- negative for UTI Chest Xray-stable CBC, Lyme IgM with reflex drawn Push fluids/rest/vit c-2,000mg /day Continue to alternate OTC Acetaminophen and Ibuprofen for muscle aches/pain as needed. Recommend OTC Zyrtec per manufacturer's instructions to reduce generalized itching. Work Excuse provided, okay to return Friday 06/23/18

## 2018-06-21 NOTE — Assessment & Plan Note (Signed)
Likely r/t to stress, lack of sleep Recommend stress reduction techniques/stop smoking Do not recommend long term use of Viagra

## 2018-06-21 NOTE — Assessment & Plan Note (Addendum)
Recommend stress reduction techniques, practice good sleep hygiene. Regular follow-up 3 months.

## 2018-06-21 NOTE — Assessment & Plan Note (Signed)
Urinalysis- negative for UTI Chest Xray-stable We will call you when lab results are available. Push fluids/rest/vit c-2,000mg /day Continue to alternate OTC Acetaminophen and Ibuprofen for muscle aches/pain as needed. Recommend OTC Zyrtec per manufacturer's instructions to reduce generalized itching. Work Excuse provided, okay to return Friday 06/23/18

## 2018-06-21 NOTE — Assessment & Plan Note (Signed)
UA neg for UTI

## 2018-06-21 NOTE — Assessment & Plan Note (Signed)
[place] Controlled Substance Database reviewed- no aberrancies noted Adderall XR 20mg  BID refilled

## 2018-06-22 ENCOUNTER — Ambulatory Visit: Payer: BLUE CROSS/BLUE SHIELD | Admitting: Adult Health

## 2018-06-22 LAB — CBC WITH DIFFERENTIAL/PLATELET
Basophils Absolute: 0 10*3/uL (ref 0.0–0.2)
Basos: 1 %
EOS (ABSOLUTE): 0.1 10*3/uL (ref 0.0–0.4)
Eos: 2 %
Hematocrit: 45 % (ref 37.5–51.0)
Hemoglobin: 15.5 g/dL (ref 13.0–17.7)
Immature Grans (Abs): 0 10*3/uL (ref 0.0–0.1)
Immature Granulocytes: 0 %
Lymphocytes Absolute: 0.7 10*3/uL (ref 0.7–3.1)
Lymphs: 12 %
MCH: 30 pg (ref 26.6–33.0)
MCHC: 34.4 g/dL (ref 31.5–35.7)
MCV: 87 fL (ref 79–97)
Monocytes Absolute: 0.5 10*3/uL (ref 0.1–0.9)
Monocytes: 9 %
Neutrophils Absolute: 4.1 10*3/uL (ref 1.4–7.0)
Neutrophils: 76 %
Platelets: 106 10*3/uL — ABNORMAL LOW (ref 150–450)
RBC: 5.17 x10E6/uL (ref 4.14–5.80)
RDW: 12.6 % (ref 12.3–15.4)
WBC: 5.5 10*3/uL (ref 3.4–10.8)

## 2018-06-22 LAB — LYME, IGM, EARLY TEST/REFLEX: LYME DISEASE AB, QUANT, IGM: <0.8 index (ref 0.00–0.79)

## 2018-09-21 ENCOUNTER — Ambulatory Visit (INDEPENDENT_AMBULATORY_CARE_PROVIDER_SITE_OTHER): Payer: BLUE CROSS/BLUE SHIELD | Admitting: Adult Health

## 2018-09-21 ENCOUNTER — Encounter: Payer: Self-pay | Admitting: Adult Health

## 2018-09-21 VITALS — BP 129/81 | HR 75 | Ht 69.0 in | Wt 194.9 lb

## 2018-09-21 DIAGNOSIS — F17200 Nicotine dependence, unspecified, uncomplicated: Secondary | ICD-10-CM | POA: Diagnosis not present

## 2018-09-21 DIAGNOSIS — Z0000 Encounter for general adult medical examination without abnormal findings: Secondary | ICD-10-CM

## 2018-09-21 DIAGNOSIS — F909 Attention-deficit hyperactivity disorder, unspecified type: Secondary | ICD-10-CM | POA: Diagnosis not present

## 2018-09-21 MED ORDER — BUPROPION HCL ER (SR) 150 MG PO TB12: 150.0000 mg | ORAL_TABLET | Freq: Two times a day (BID) | ORAL | 2 refills | Status: DC

## 2018-09-21 MED ORDER — AMPHETAMINE-DEXTROAMPHET ER 20 MG PO CP24: ORAL_CAPSULE | ORAL | 0 refills | Status: DC

## 2018-09-21 NOTE — Assessment & Plan Note (Addendum)
Pack/day Failed Nicodrem patches Wellbutrin 150mg  QD for 3 days, then increase to BID-hold at this dose  Start tapering off cigarettes two weeks after starting Wellbutrin. Send MyChart message in 4 weeks and let me know how you tolerating new medication.

## 2018-09-21 NOTE — Assessment & Plan Note (Signed)
Continue Adderall XR 20mg  twice daily. Start Wellbutrin 150mg  QD for 3 days, then increase to twice daily-hold at this dose.  Start tapering off cigarettes two weeks after starting Wellbutrin. Send MyChart message in 4 weeks and let me know how you tolerating new medication. Follow-up in 3 months.

## 2018-09-21 NOTE — Patient Instructions (Signed)

## 2018-09-21 NOTE — Progress Notes (Signed)
Subjective:    Patient ID: Tony Thornton, male    DOB: 08/06/1986, 32 y.o.   MRN: [id number]  HPI:  Tony Thornton is here for 3 month f/u ADHD He reports improved concentration/focus on Adderall XR 20mg  QD He continues to smoke pack/day He has tried/failed nicoderm patch in past He is agreeable to Wellbutrin He denies CP/dyspnea/HA/palpitations/dizziness/insomnia He denies formal exercise He estimates to drink 80 oz water day and tries to eat a heart healthy diet He will be moving to [place] Feb 2020 to work "[employer]" Adult ADHD Self-Report Scale Sx Checklist- Part A Shaded Area's- 3   Patient Care Team    Relationship Specialty Notifications Start End  Traci Gafford, Jinny Blossom, NP PCP - General Family Medicine  12/08/17     Patient Active Problem List   Diagnosis Date Noted  . Fever and chills 06/21/2018  . Cough in adult 06/21/2018  . Myalgia 06/21/2018  . Dysuria 06/21/2018  . Pruritus 06/05/2018  . Vitamin D deficiency 03/23/2018  . Acute maxillary sinusitis 03/23/2018  . Tobacco use disorder 01/19/2018  . Healthcare maintenance 12/08/2017  . Erectile dysfunction 12/08/2017  . ADHD 12/08/2017     Past Medical History:  Diagnosis Date  . ADHD   . H/O cervical fracture      Past Surgical History:  Procedure Laterality Date  . broken neck       Family History  Problem Relation Age of Onset  . Hypertension Father   . Alcohol abuse Father   . Hyperlipidemia Father   . Early death Sister        age 35- unknown cause  . Dementia Maternal Grandmother   . COPD Maternal Grandfather        colon  . Hypertension Paternal Grandmother   . Hyperlipidemia Paternal Grandmother   . COPD Paternal Grandmother   . Hypothyroidism Paternal Grandmother   . Cancer Paternal Grandfather        esophageal     Social History   Substance and Sexual Activity  Drug Use No     Social History   Substance and Sexual Activity  Alcohol Use Yes   Comment: ocassionally      Social History   Tobacco Use  Smoking Status Current Every Day Smoker  . Packs/day: 1.00  . Years: 14.00  . Pack years: 14.00  . Types: Cigarettes  Smokeless Tobacco Former User     Outpatient Encounter Medications as of 09/21/2018  Medication Sig  . amphetamine-dextroamphetamine (ADDERALL XR) 20 MG 24 hr capsule 1 tab twice daily   No facility-administered encounter medications on file as of 09/21/2018.     Allergies: Other  Body mass index is 28.78 kg/m.  Blood pressure 129/81, pulse 75, height 5\' 9"  (1.753 m), weight 194 lb 14.4 oz (88.4 kg), SpO2 99 %.  Review of Systems  Constitutional: Positive for fatigue. Negative for activity change, appetite change, chills, diaphoresis, fever and unexpected weight change.  Eyes: Negative for visual disturbance.  Respiratory: Negative for cough, chest tightness, shortness of breath, wheezing and stridor.   Cardiovascular: Negative for chest pain, palpitations and leg swelling.  Neurological: Negative for dizziness and headaches.  Hematological: Does not bruise/bleed easily.  Psychiatric/Behavioral: Positive for decreased concentration. Negative for agitation, behavioral problems, confusion, dysphoric mood, hallucinations, self-injury, sleep disturbance and suicidal ideas. The patient is hyperactive. The patient is not nervous/anxious.        Objective:   Physical Exam  Constitutional: He is oriented to person,  place, and time. He appears well-developed and well-nourished. No distress.  HENT:  Head: Normocephalic and atraumatic.  Right Ear: External ear normal.  Left Ear: External ear normal.  Nose: Nose normal.  Mouth/Throat: Oropharynx is clear and moist.  Cardiovascular: Normal rate, regular rhythm, normal heart sounds and intact distal pulses.  No murmur heard. Pulmonary/Chest: Breath sounds normal. No stridor. No respiratory distress. He has no wheezes. He has no rales. He exhibits no tenderness.  Neurological:  He is alert and oriented to person, place, and time.  Skin: Skin is warm and dry. Capillary refill takes less than 2 seconds. No rash noted. He is not diaphoretic. No erythema. No pallor.  Psychiatric: He has a normal mood and affect. His behavior is normal. Judgment and thought content normal.  Nursing note and vitals reviewed.     Assessment & Plan:   1. Attention deficit hyperactivity disorder (ADHD), unspecified ADHD type   2. Tobacco use disorder   3. Healthcare maintenance     ADHD [place] Controlled Substance Database reviewed- no aberrancies noted Adderall XR 20mg  BID refilled Next OV in 3 months Started on Wellbutrin for smoking cessation, which will also help control ADHD sx's  Tobacco use disorder Pack/day Failed Nicodrem patches Wellbutrin 150mg  QD for 3 days, then increase to BID-hold at this dose  Start tapering off cigarettes two weeks after starting Wellbutrin. Send MyChart message in 4 weeks and let me know how you tolerating new medication.   Healthcare maintenance Continue Adderall XR 20mg  twice daily. Start Wellbutrin 150mg  QD for 3 days, then increase to twice daily-hold at this dose.  Start tapering off cigarettes two weeks after starting Wellbutrin. Send MyChart message in 4 weeks and let me know how you tolerating new medication. Follow-up in 3 months.    FOLLOW-UP:  Return in about 3 months (around 12/22/2018) for Regular Follow Up, ADHD Medication.

## 2018-09-21 NOTE — Assessment & Plan Note (Addendum)
[place] Controlled Substance Database reviewed- no aberrancies noted Adderall XR 20mg  BID refilled Next OV in 3 months Started on Wellbutrin for smoking cessation, which will also help control ADHD sx's

## 2018-12-19 NOTE — Progress Notes (Signed)
Subjective:    Patient ID: Tony Thornton, male    DOB: [birth year], 33 y.o.   MRN: [id number]  HPI:09/21/18 OV:   Tony Thornton is here for 3 month f/u ADHD He reports improved concentration/focus on Adderall XR 20mg  QD He continues to smoke pack/day He has tried/failed nicoderm patch in past He is agreeable to Wellbutrin He denies CP/dyspnea/HA/palpitations/dizziness/insomnia He denies formal exercise He estimates to drink 80 oz water day and tries to eat a heart healthy diet He will be moving to [place] Feb 2020 to work "[employer]" Adult ADHD Self-Report Scale Sx Checklist- Part A Shaded Area's- 3   12/21/2018 OV: Tony Thornton is here for 3 month f/u: ADHD He reports medication compliance on Adderall XR 20mg  QD, reports good concentration and focus, denies CP/palpitations/insomnia He tried Wellbutrin for 6 weeks- still continued to smoke heavily, stopped rx He continues to smoke pack/day, declined any other forms of tobacco cessation He estimates to drink 40oz plain water, prefers to hydrate with coffee and sweat tea He denies formal exercise outside of work He recently became engaged over holidays, will be moving to [place] this March  Patient Care Team    Relationship Specialty Notifications Start End  William Hamburger D, NP PCP - General Family Medicine  12/08/17     Patient Active Problem List   Diagnosis Date Noted  . Fever and chills 06/21/2018  . Myalgia 06/21/2018  . Dysuria 06/21/2018  . Pruritus 06/05/2018  . Vitamin D deficiency 03/23/2018  . Acute maxillary sinusitis 03/23/2018  . Tobacco use disorder 01/19/2018  . Healthcare maintenance 12/08/2017  . ADHD 12/08/2017     Past Medical History:  Diagnosis Date  . ADHD   . H/O cervical fracture      Past Surgical History:  Procedure Laterality Date  . broken neck       Family History  Problem Relation Age of Onset  . Hypertension Father   . Alcohol abuse Father   . Hyperlipidemia Father   . Early  death Sister        age 47- unknown cause  . Dementia Maternal Grandmother   . COPD Maternal Grandfather        colon  . Hypertension Paternal Grandmother   . Hyperlipidemia Paternal Grandmother   . COPD Paternal Grandmother   . Hypothyroidism Paternal Grandmother   . Cancer Paternal Grandfather        esophageal     Social History   Substance and Sexual Activity  Drug Use No     Social History   Substance and Sexual Activity  Alcohol Use Yes   Comment: ocassionally     Social History   Tobacco Use  Smoking Status Current Every Day Smoker  . Packs/day: 1.00  . Years: 14.00  . Pack years: 14.00  . Types: Cigarettes  Smokeless Tobacco Former User     Outpatient Encounter Medications as of 12/21/2018  Medication Sig  . amphetamine-dextroamphetamine (ADDERALL XR) 20 MG 24 hr capsule 1 tab twice daily  . [DISCONTINUED] amphetamine-dextroamphetamine (ADDERALL XR) 20 MG 24 hr capsule 1 tab twice daily  . [DISCONTINUED] buPROPion (WELLBUTRIN SR) 150 MG 12 hr tablet Take 1 tablet (150 mg total) by mouth 2 (two) times daily.   No facility-administered encounter medications on file as of 12/21/2018.     Allergies: Other  Body mass index is 29.31 kg/m.  Blood pressure 130/74, pulse 86, temperature (!) 97.5 F (36.4 C), temperature source Oral, height 5'  9" (1.753 m), weight 198 lb 8 oz (90 kg), SpO2 100 %.  Review of Systems  Constitutional: Positive for fatigue. Negative for activity change, appetite change, chills, diaphoresis, fever and unexpected weight change.  Eyes: Negative for visual disturbance.  Respiratory: Negative for cough, chest tightness, shortness of breath, wheezing and stridor.   Cardiovascular: Negative for chest pain, palpitations and leg swelling.  Neurological: Negative for dizziness and headaches.  Hematological: Does not bruise/bleed easily.  Psychiatric/Behavioral: Positive for decreased concentration. Negative for agitation, behavioral  problems, confusion, dysphoric mood, hallucinations, self-injury, sleep disturbance and suicidal ideas. The patient is hyperactive. The patient is not nervous/anxious.        Objective:   Physical Exam Vitals signs and nursing note reviewed.  Constitutional:      General: He is not in acute distress.    Appearance: He is well-developed. He is not diaphoretic.  HENT:     Head: Normocephalic and atraumatic.     Right Ear: External ear normal.     Left Ear: External ear normal.     Nose: Nose normal.  Cardiovascular:     Rate and Rhythm: Normal rate and regular rhythm.     Heart sounds: Normal heart sounds. No murmur.  Pulmonary:     Effort: No respiratory distress.     Breath sounds: Normal breath sounds. No stridor. No wheezing or rales.  Chest:     Chest wall: No tenderness.  Skin:    General: Skin is warm and dry.     Capillary Refill: Capillary refill takes less than 2 seconds.     Coloration: Skin is not pale.     Findings: No erythema or rash.  Neurological:     Mental Status: He is alert and oriented to person, place, and time.  Psychiatric:        Behavior: Behavior normal.        Thought Content: Thought content normal.        Judgment: Judgment normal.       Assessment & Plan:   1. Tobacco use disorder   2. Attention deficit hyperactivity disorder (ADHD), unspecified ADHD type   3. Healthcare maintenance     ADHD [place] Controlled Substance Database reviewed- no aberrancies noted Adderall XR 20mg  refilled, 180 count F/u 3 months  Healthcare maintenance Continue Adderall XR 20mg  once daily. Increase plain water intake and follow Mediterranean Diet. Try to reduce to stop tobacco use- you can do it! Daisey Must with move! Return in 3 months for complete physical with fasting labs.  Tobacco use disorder Still smoking heavily after 6 weeks of Wellbutrin use, he has stopped rx He continues to smoke pack/day He declined other forms of tobacco  cessation    FOLLOW-UP:  Return in about 3 months (around 03/22/2019) for CPE, Fasting Labs.

## 2018-12-21 ENCOUNTER — Ambulatory Visit (INDEPENDENT_AMBULATORY_CARE_PROVIDER_SITE_OTHER): Payer: Self-pay | Admitting: Adult Health

## 2018-12-21 ENCOUNTER — Encounter: Payer: Self-pay | Admitting: Adult Health

## 2018-12-21 VITALS — BP 130/74 | HR 86 | Temp 97.5°F | Ht 69.0 in | Wt 198.5 lb

## 2018-12-21 DIAGNOSIS — Z0000 Encounter for general adult medical examination without abnormal findings: Secondary | ICD-10-CM

## 2018-12-21 DIAGNOSIS — F909 Attention-deficit hyperactivity disorder, unspecified type: Secondary | ICD-10-CM

## 2018-12-21 DIAGNOSIS — F17200 Nicotine dependence, unspecified, uncomplicated: Secondary | ICD-10-CM

## 2018-12-21 MED ORDER — AMPHETAMINE-DEXTROAMPHET ER 20 MG PO CP24: ORAL_CAPSULE | ORAL | 0 refills | Status: DC

## 2018-12-21 NOTE — Assessment & Plan Note (Signed)
Still smoking heavily after 6 weeks of Wellbutrin use, he has stopped rx He continues to smoke pack/day He declined other forms of tobacco cessation

## 2018-12-21 NOTE — Patient Instructions (Signed)
Mediterranean Diet A Mediterranean diet refers to food and lifestyle choices that are based on the traditions of countries located on the [employer]. This way of eating has been shown to help prevent certain conditions and improve outcomes for people who have chronic diseases, like kidney disease and heart disease. What are tips for following this plan? Lifestyle  Cook and eat meals together with your family, when possible.  Drink enough fluid to keep your urine clear or pale yellow.  Be physically active every day. This includes: ? Aerobic exercise like running or swimming. ? Leisure activities like gardening, walking, or housework.  Get 7-8 hours of sleep each night.  If recommended by your health care provider, drink red wine in moderation. This means 1 glass a day for nonpregnant women and 2 glasses a day for men. A glass of wine equals 5 oz (150 mL). Reading food labels   Check the serving size of packaged foods. For foods such as rice and pasta, the serving size refers to the amount of cooked product, not dry.  Check the total fat in packaged foods. Avoid foods that have saturated fat or trans fats.  Check the ingredients list for added sugars, such as corn syrup. Shopping  At the grocery store, buy most of your food from the areas near the walls of the store. This includes: ? Fresh fruits and vegetables (produce). ? Grains, beans, nuts, and seeds. Some of these may be available in unpackaged forms or large amounts (in bulk). ? Fresh seafood. ? Poultry and eggs. ? Low-fat dairy products.  Buy whole ingredients instead of prepackaged foods.  Buy fresh fruits and vegetables in-season from local farmers markets.  Buy frozen fruits and vegetables in resealable bags.  If you do not have access to quality fresh seafood, buy precooked frozen shrimp or canned fish, such as tuna, salmon, or sardines.  Buy small amounts of raw or cooked vegetables, salads, or olives from  the deli or salad bar at your store.  Stock your pantry so you always have certain foods on hand, such as olive oil, canned tuna, canned tomatoes, rice, pasta, and beans. Cooking  Cook foods with extra-virgin olive oil instead of using butter or other vegetable oils.  Have meat as a side dish, and have vegetables or grains as your main dish. This means having meat in small portions or adding small amounts of meat to foods like pasta or stew.  Use beans or vegetables instead of meat in common dishes like chili or lasagna.  Experiment with different cooking methods. Try roasting or broiling vegetables instead of steaming or sauteing them.  Add frozen vegetables to soups, stews, pasta, or rice.  Add nuts or seeds for added healthy fat at each meal. You can add these to yogurt, salads, or vegetable dishes.  Marinate fish or vegetables using olive oil, lemon juice, garlic, and fresh herbs. Meal planning   Plan to eat 1 vegetarian meal one day each week. Try to work up to 2 vegetarian meals, if possible.  Eat seafood 2 or more times a week.  Have healthy snacks readily available, such as: ? Vegetable sticks with hummus. ? [place] yogurt. ? Fruit and nut trail mix.  Eat balanced meals throughout the week. This includes: ? Fruit: 2-3 servings a day ? Vegetables: 4-5 servings a day ? Low-fat dairy: 2 servings a day ? Fish, poultry, or lean meat: 1 serving a day ? Beans and legumes: 2 or more servings a week ?  Nuts and seeds: 1-2 servings a day ? Whole grains: 6-8 servings a day ? Extra-virgin olive oil: 3-4 servings a day  Limit red meat and sweets to only a few servings a month What are my food choices?  Mediterranean diet ? Recommended ? Grains: Whole-grain pasta. Brown rice. Bulgar wheat. Polenta. Couscous. Whole-wheat bread. Orpah Cobb. ? Vegetables: Artichokes. Beets. Broccoli. Cabbage. Carrots. Eggplant. Green beans. Chard. Kale. Spinach. Onions. Leeks. Peas. Squash.  Tomatoes. Peppers. Radishes. ? Fruits: Apples. Apricots. Avocado. Berries. Bananas. Cherries. Dates. Figs. Grapes. Lemons. Melon. Oranges. Peaches. Plums. Pomegranate. ? Meats and other protein foods: Beans. Almonds. Sunflower seeds. Pine nuts. Peanuts. Cod. Salmon. Scallops. Shrimp. Tuna. Tilapia. Clams. Oysters. Eggs. ? Dairy: Low-fat milk. Cheese. Greek yogurt. ? Beverages: Water. Red wine. Herbal tea. ? Fats and oils: Extra virgin olive oil. Avocado oil. Grape seed oil. ? Sweets and desserts: [place] yogurt with honey. Baked apples. Poached pears. Trail mix. ? Seasoning and other foods: Basil. Cilantro. Coriander. Cumin. Mint. Parsley. Sage. Rosemary. Tarragon. Garlic. Oregano. Thyme. Pepper. Balsalmic vinegar. Tahini. Hummus. Tomato sauce. Olives. Mushrooms. ? Limit these ? Grains: Prepackaged pasta or rice dishes. Prepackaged cereal with added sugar. ? Vegetables: Deep fried potatoes (french fries). ? Fruits: Fruit canned in syrup. ? Meats and other protein foods: Beef. Pork. Lamb. Poultry with skin. Hot dogs. Tomasa Blase. ? Dairy: Ice cream. Sour cream. Whole milk. ? Beverages: Juice. Sugar-sweetened soft drinks. Beer. Liquor and spirits. ? Fats and oils: Butter. Canola oil. Vegetable oil. Beef fat (tallow). Lard. ? Sweets and desserts: Cookies. Cakes. Pies. Candy. ? Seasoning and other foods: Mayonnaise. Premade sauces and marinades. ? The items listed may not be a complete list. Talk with your dietitian about what dietary choices are right for you. Summary  The Mediterranean diet includes both food and lifestyle choices.  Eat a variety of fresh fruits and vegetables, beans, nuts, seeds, and whole grains.  Limit the amount of red meat and sweets that you eat.  Talk with your health care provider about whether it is safe for you to drink red wine in moderation. This means 1 glass a day for nonpregnant women and 2 glasses a day for men. A glass of wine equals 5 oz (150 mL). This information  is not intended to replace advice given to you by your health care provider. Make sure you discuss any questions you have with your health care provider. Document Released: 07/08/2016 Document Revised: 08/10/2016 Document Reviewed: 07/08/2016 Elsevier Interactive Patient Education  2019 Elsevier Inc.   Living With Attention Deficit Hyperactivity Disorder If you have been diagnosed with attention deficit hyperactivity disorder (ADHD), you may be relieved that you now know why you have felt or behaved a certain way. Still, you may feel overwhelmed about the treatment ahead. You may also wonder how to get the support you need and how to deal with the condition day-to-day. With treatment and support, you can live with ADHD and manage your symptoms. How to manage lifestyle changes Managing stress Stress is your body's reaction to life changes and events, both good and bad. To cope with the stress of an ADHD diagnosis, it may help to:  Learn more about ADHD.  Exercise regularly. Even a short daily walk can lower stress levels.  Participate in training or education programs (including social skills training classes) that teach you to deal with symptoms.  Medicines Your health care provider may suggest certain medicines if he or she feels that they will help to improve your condition.  Stimulant medicines are usually prescribed to treat ADHD, and therapy may also be prescribed. It is important to:  Avoid using alcohol and other substances that may prevent your medicines from working properly [facility](mayinteract).  Talk with your pharmacist or health care provider about all the medicines that you take, their possible side effects, and what medicines are safe to take together.  Make it your goal to take part in all treatment decisions (shared decision-making). Ask about possible side effects of medicines that your health care provider recommends, and tell him or her how you feel about having those side  effects. It is best if shared decision-making with your health care provider is part of your total treatment plan. Relationships To strengthen your relationships with family members while treating your condition, consider taking part in family therapy. You might also attend self-help groups alone or with a loved one. Be honest about how your symptoms affect your relationships. Make an effort to communicate respectfully instead of fighting, and find ways to show others that you care. Psychotherapy may be useful in helping you cope with how ADHD affects your relationships. How to recognize changes in your condition The following signs may mean that your treatment is working well and your condition is improving:  Consistently being on time for appointments.  Being more organized at home and work.  Other people noticing improvements in your behavior.  Achieving goals that you set for yourself.  Thinking more clearly. The following signs may mean that your treatment is not working very well:  Feeling impatience or more confusion.  Missing, forgetting, or being late for appointments.  An increasing sense of disorganization and messiness.  More difficulty in reaching goals that you set for yourself.  Loved ones becoming angry or frustrated with you. Where to find support Talking to others  Keep emotion out of important discussions and speak in a calm, logical way.  Listen closely and patiently to your loved ones. Try to understand their point of view, and try to avoid getting defensive.  Take responsibility for the consequences of your actions.  Ask that others do not take your behaviors personally.  Aim to solve problems as they come up, and express your feelings instead of bottling them up.  Talk openly about what you need from your loved ones and how they can support you.  Consider going to family therapy sessions or having your family meet with a specialist who deals with  ADHD-related behavior problems. Finances Not all insurance plans cover mental health care, so it is important to check with your insurance carrier. If paying for co-pays or counseling services is a problem, search for a local or county mental health care center. Public mental health care services may be offered there at a low cost or no cost when you are not able to see a private health care provider. If you are taking medicine for ADHD, you may be able to get the generic form, which may be less expensive than brand-name medicine. Some makers of prescription medicines also offer help to patients who cannot afford the medicines that they need. Follow these instructions at home:  Take over-the-counter and prescription medicines only as told by your health care provider. Check with your health care provider before taking any new medicines.  Create structure and an organized atmosphere at home. For example: ? Make a list of tasks, then rank them from most important to least important. Work on one task at a time until your listed tasks  are done. ? Make a daily schedule and follow it consistently every day. ? Use an appointment calendar, and check it 2 or 3 times a day to keep on track. Keep it with you when you leave the house. ? Create spaces where you keep certain things, and always put things back in their places after you use them.  Keep all follow-up visits as told by your health care provider. This is important. Questions to ask your health care provider:  What are the risks and benefits of taking medicines?  Would I benefit from therapy?  How often should I follow up with a health care provider? Contact a health care provider if:  You have side effects from your medicines, such as: ? Repeated muscle twitches, coughing, or speech outbursts. ? Sleep problems. ? Loss of appetite. ? Depression. ? New or worsening behavior problems. ? Dizziness. ? Unusually fast heartbeat. ? Stomach  pains. ? Headaches. Get help right away if:  You have a severe reaction to a medicine.  Your behavior suddenly gets worse. Summary  With treatment and support, you can live with ADHD and manage your symptoms.  The medicines that are most often prescribed for ADHD are stimulants.  Consider taking part in family therapy or self-help groups with family members or friends.  When you talk with friends and family about your ADHD, be patient and communicate openly.  Take over-the-counter and prescription medicines only as told by your health care provider. Check with your health care provider before taking any new medicines. This information is not intended to replace advice given to you by your health care provider. Make sure you discuss any questions you have with your health care provider. Document Released: 03/17/2017 Document Revised: 03/17/2017 Document Reviewed: 03/17/2017 Elsevier Interactive Patient Education  2019 Elsevier Inc.  Continue Adderall XR 20mg  once daily. Increase plain water intake and follow Mediterranean Diet. Try to reduce to stop tobacco use- you can do it! Daisey Must with move! Return in 3 months for complete physical with fasting labs. CONGRATULATIONS ON YOUR RECENT ENGAGEMENT!

## 2018-12-21 NOTE — Assessment & Plan Note (Signed)
Continue Adderall XR 20mg  once daily. Increase plain water intake and follow Mediterranean Diet. Try to reduce to stop tobacco use- you can do it! Tony Thornton with move! Return in 3 months for complete physical with fasting labs.

## 2018-12-21 NOTE — Assessment & Plan Note (Signed)
[place] Controlled Substance Database reviewed- no aberrancies noted Adderall XR 20mg  refilled, 180 count F/u 3 months

## 2019-01-17 IMAGING — DX DG CHEST 2V
2 series · 2 of 2 positions shown · non-contrast
Comparison: 10/04/2015

CLINICAL DATA: Fever and chills

EXAM:
CHEST - 2 VIEW

[chest pa]
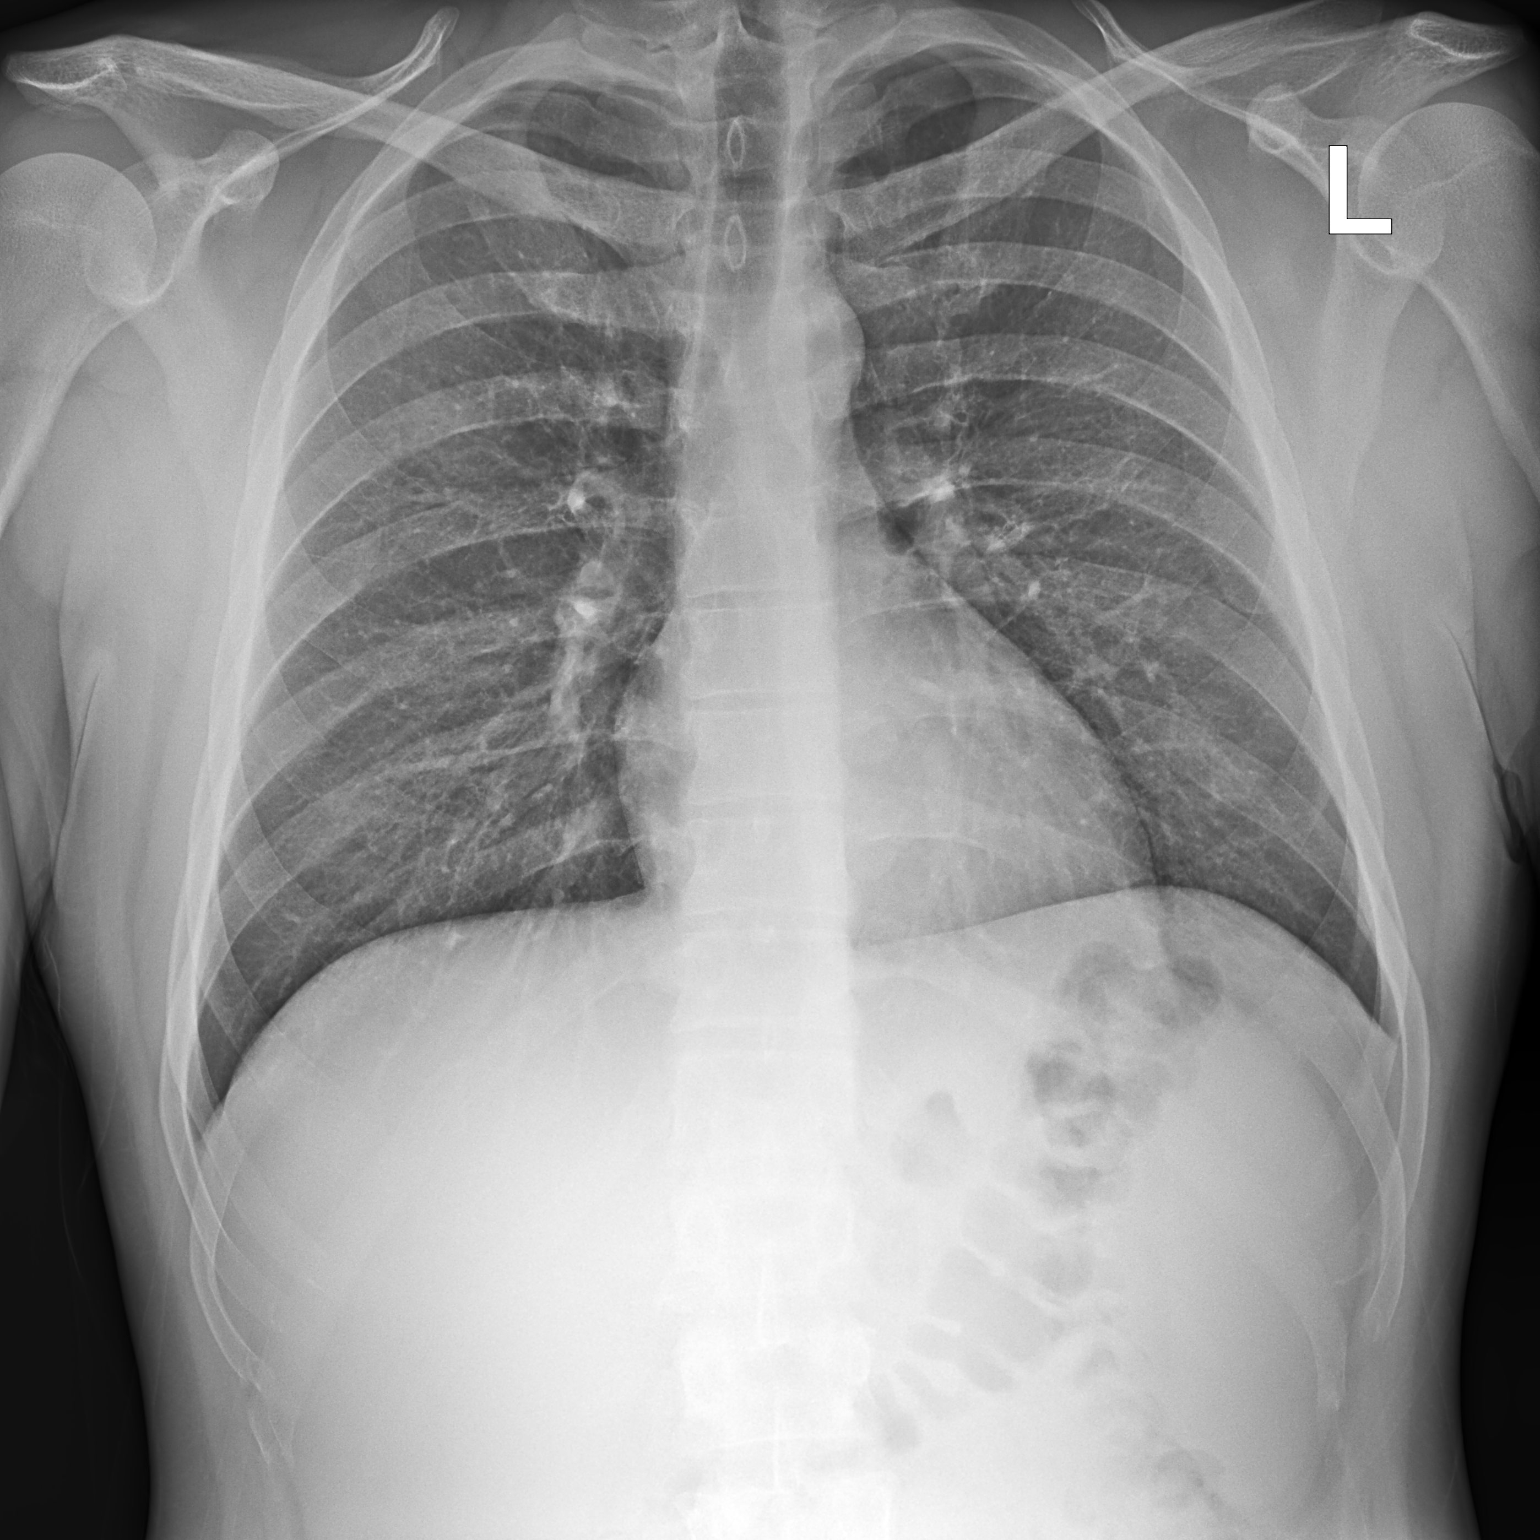

[chest lat]
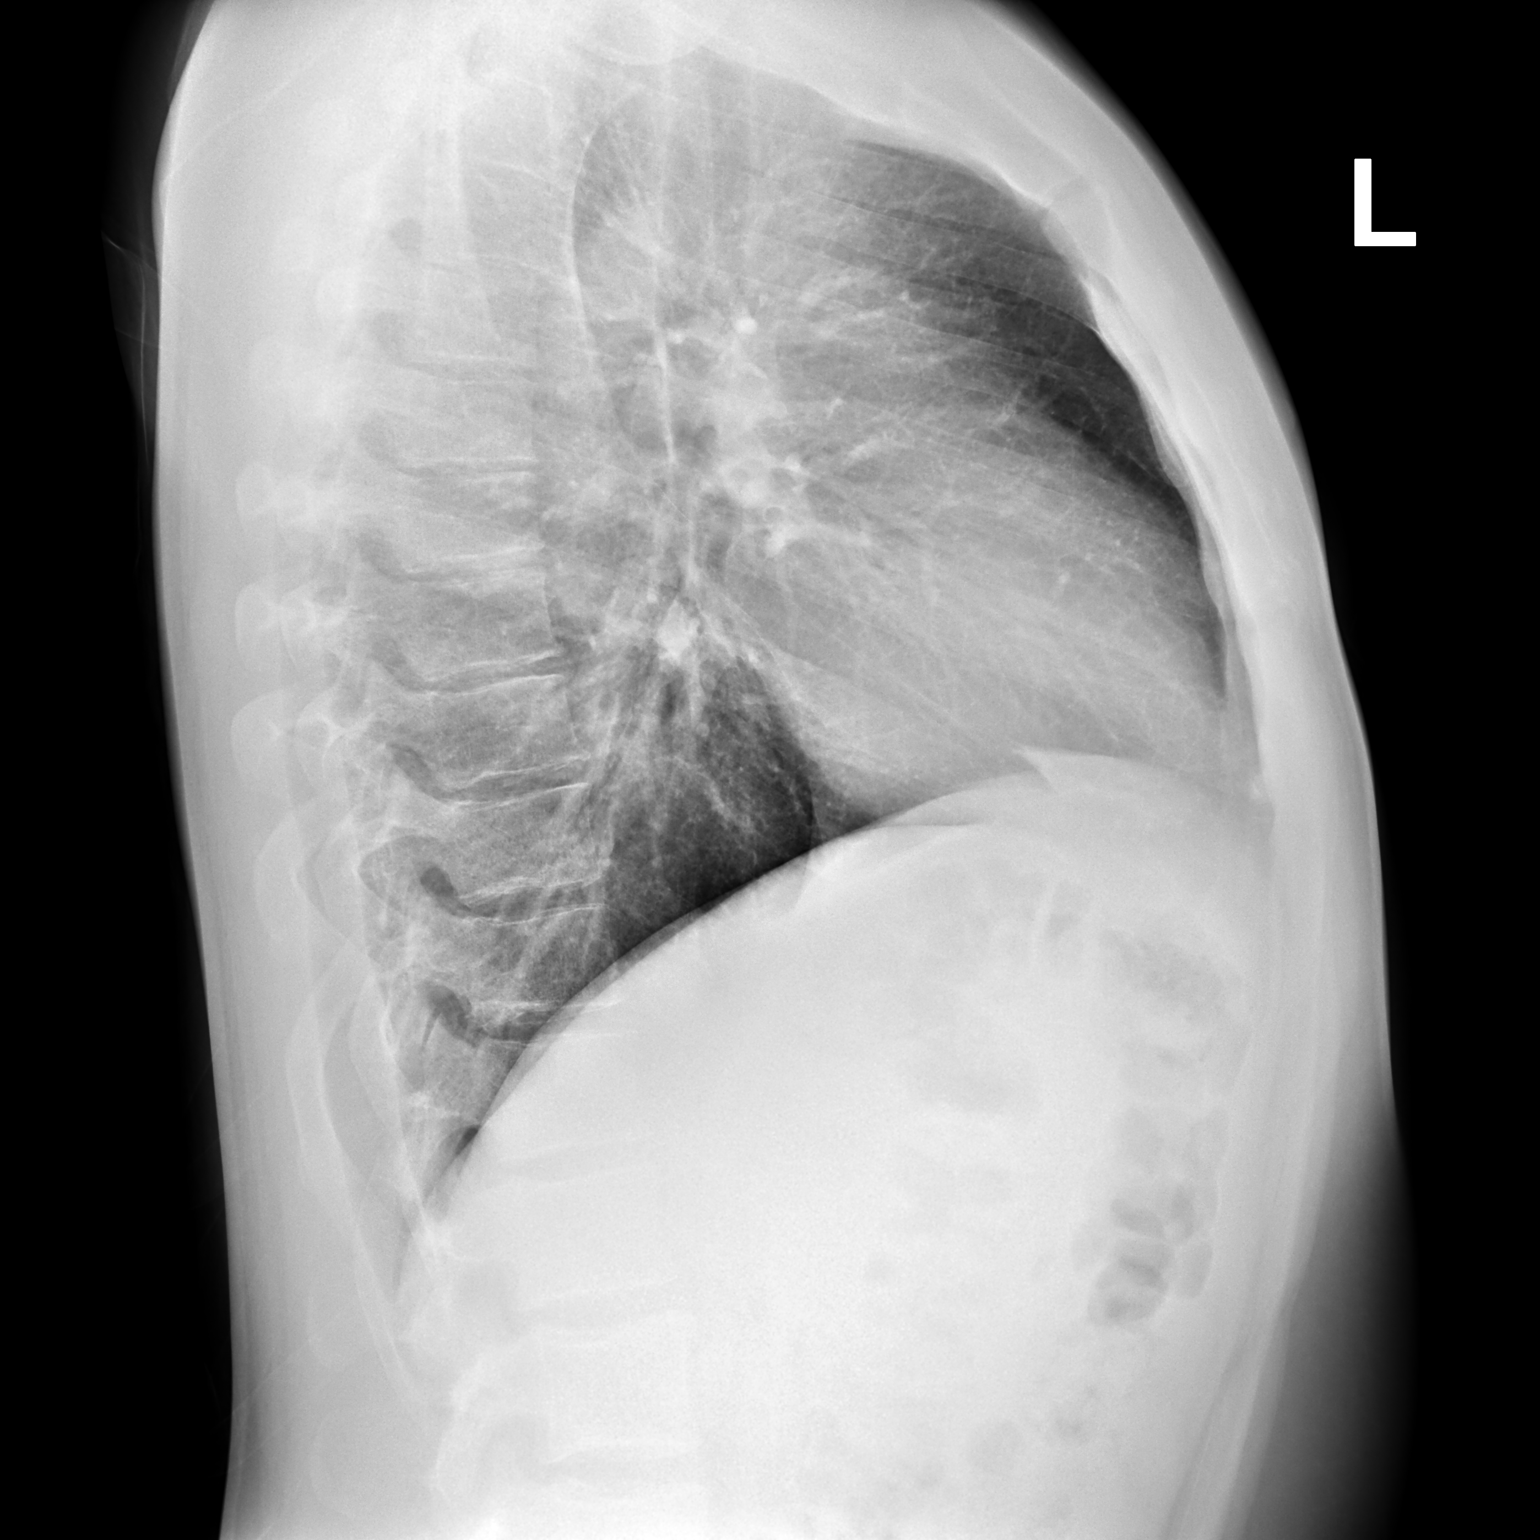

[2 of 2 positions shown; findings below may reference images not displayed]

FINDINGS: The heart size and mediastinal contours are within normal limits.
Both lungs are clear. The visualized skeletal structures are
unremarkable.
IMPRESSION: No active cardiopulmonary disease.

## 2019-03-29 ENCOUNTER — Encounter: Payer: Self-pay | Admitting: Adult Health

## 2019-04-26 ENCOUNTER — Telehealth: Payer: Self-pay

## 2019-04-26 DIAGNOSIS — N529 Male erectile dysfunction, unspecified: Secondary | ICD-10-CM

## 2019-04-26 NOTE — Telephone Encounter (Signed)
Pt called requesting referral to urology for ED.  Referral placed.  Pt aware.  Tiajuana Amass, CMA

## 2019-04-30 ENCOUNTER — Encounter: Payer: Self-pay | Admitting: Adult Health

## 2019-04-30 ENCOUNTER — Ambulatory Visit (INDEPENDENT_AMBULATORY_CARE_PROVIDER_SITE_OTHER): Payer: Self-pay | Admitting: Adult Health

## 2019-04-30 ENCOUNTER — Other Ambulatory Visit: Payer: Self-pay

## 2019-04-30 VITALS — Wt 190.0 lb

## 2019-04-30 DIAGNOSIS — F411 Generalized anxiety disorder: Secondary | ICD-10-CM

## 2019-04-30 DIAGNOSIS — F17200 Nicotine dependence, unspecified, uncomplicated: Secondary | ICD-10-CM

## 2019-04-30 DIAGNOSIS — F17210 Nicotine dependence, cigarettes, uncomplicated: Secondary | ICD-10-CM

## 2019-04-30 DIAGNOSIS — F6381 Intermittent explosive disorder: Secondary | ICD-10-CM | POA: Insufficient documentation

## 2019-04-30 DIAGNOSIS — F339 Major depressive disorder, recurrent, unspecified: Secondary | ICD-10-CM | POA: Insufficient documentation

## 2019-04-30 MED ORDER — FLUOXETINE HCL 20 MG PO TABS: ORAL_TABLET | ORAL | 0 refills | Status: DC

## 2019-04-30 NOTE — Assessment & Plan Note (Signed)
He stopped tobacco use two weeks- great!

## 2019-04-30 NOTE — Assessment & Plan Note (Signed)
Assessment and Plan: Remain off Adderall Start Fluoxetine 20mg- 1/2 tab QD 7 days, then increase to full tab- aiming to treat depression/GAD/intermittent explosive disorder Urgent referral to Psychology Increase regular exercise Follow Mediterranean Diet Seek immediate medial assistance if any thoughts of SI/HI  Follow Up Instructions: 3-4 weeks TeleMedicine    I discussed the assessment and treatment plan with the patient. The patient was provided an opportunity to ask questions and all were answered. The patient agreed with the plan and demonstrated an understanding of the instructions.   The patient was advised to call back or seek an in-person evaluation if the symptoms worsen or if the condition fails to improve as anticipated.  

## 2019-04-30 NOTE — Assessment & Plan Note (Signed)
Assessment and Plan: Remain off Adderall Start Fluoxetine 20mg - 1/2 tab QD 7 days, then increase to full tab- aiming to treat depression/GAD/intermittent explosive disorder Urgent referral to Psychology Increase regular exercise Follow Mediterranean Diet Seek immediate medial assistance if any thoughts of SI/HI  Follow Up Instructions: 3-4 weeks TeleMedicine    I discussed the assessment and treatment plan with the patient. The patient was provided an opportunity to ask questions and all were answered. The patient agreed with the plan and demonstrated an understanding of the instructions.   The patient was advised to call back or seek an in-person evaluation if the symptoms worsen or if the condition fails to improve as anticipated.
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
## 2019-05-07 ENCOUNTER — Ambulatory Visit (INDEPENDENT_AMBULATORY_CARE_PROVIDER_SITE_OTHER): Payer: BC Managed Care – PPO | Admitting: Psychology

## 2019-05-07 DIAGNOSIS — F1020 Alcohol dependence, uncomplicated: Secondary | ICD-10-CM | POA: Diagnosis not present

## 2019-05-15 ENCOUNTER — Ambulatory Visit (INDEPENDENT_AMBULATORY_CARE_PROVIDER_SITE_OTHER): Payer: BC Managed Care – PPO | Admitting: Psychology

## 2019-05-15 DIAGNOSIS — F1020 Alcohol dependence, uncomplicated: Secondary | ICD-10-CM

## 2019-05-23 DIAGNOSIS — N528 Other male erectile dysfunction: Secondary | ICD-10-CM | POA: Diagnosis not present
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
## 2019-05-28 ENCOUNTER — Ambulatory Visit (INDEPENDENT_AMBULATORY_CARE_PROVIDER_SITE_OTHER): Payer: BC Managed Care – PPO | Admitting: Adult Health

## 2019-05-28 ENCOUNTER — Other Ambulatory Visit: Payer: Self-pay

## 2019-05-28 ENCOUNTER — Encounter: Payer: Self-pay | Admitting: Adult Health

## 2019-05-28 DIAGNOSIS — F339 Major depressive disorder, recurrent, unspecified: Secondary | ICD-10-CM

## 2019-05-28 DIAGNOSIS — F411 Generalized anxiety disorder: Secondary | ICD-10-CM

## 2019-05-28 MED ORDER — FLUOXETINE HCL 40 MG PO CAPS: 40.0000 mg | ORAL_CAPSULE | Freq: Every day | ORAL | 0 refills | Status: DC

## 2019-05-28 NOTE — Assessment & Plan Note (Signed)
Vehemently denies any suicidal plans He has established with therapist, has had 2 appt's next one in 2 days. He speaks with his mother daily He has started regular exercise program -cardio/lifting 4 times per week. He reports reduction in "feeling sad". Increased Fluoxetine from 20mg  to 40mg  QD Advised to seek immediate medical assistance if he has thoughts of harming himself/others- he verbalized understanding/agreement. F/u 4 weeks TeleMedicine

## 2019-05-28 NOTE — Assessment & Plan Note (Signed)
Vehemently denies any suicidal plans He has established with therapist, has had 2 appt's next one in 2 days. He speaks with his mother daily He has started regular exercise program -cardio/lifting 4 times per week. He reports reduction in "feeling sad". Increased Fluoxetine from 20mg to 40mg QD Advised to seek immediate medical assistance if he has thoughts of harming himself/others- he verbalized understanding/agreement. F/u 4 weeks TeleMedicine  

## 2019-05-29 ENCOUNTER — Ambulatory Visit (INDEPENDENT_AMBULATORY_CARE_PROVIDER_SITE_OTHER): Payer: BC Managed Care – PPO | Admitting: Psychology

## 2019-05-29 DIAGNOSIS — F1020 Alcohol dependence, uncomplicated: Secondary | ICD-10-CM | POA: Diagnosis not present

## 2019-05-30 DIAGNOSIS — N528 Other male erectile dysfunction: Secondary | ICD-10-CM | POA: Diagnosis not present

## 2019-06-14 ENCOUNTER — Ambulatory Visit (INDEPENDENT_AMBULATORY_CARE_PROVIDER_SITE_OTHER): Payer: BC Managed Care – PPO | Admitting: Psychology

## 2019-06-14 DIAGNOSIS — F1020 Alcohol dependence, uncomplicated: Secondary | ICD-10-CM

## 2019-06-27 ENCOUNTER — Ambulatory Visit (INDEPENDENT_AMBULATORY_CARE_PROVIDER_SITE_OTHER): Payer: BC Managed Care – PPO | Admitting: Adult Health

## 2019-06-27 ENCOUNTER — Encounter: Payer: Self-pay | Admitting: Adult Health

## 2019-06-27 ENCOUNTER — Other Ambulatory Visit: Payer: Self-pay

## 2019-06-27 DIAGNOSIS — F339 Major depressive disorder, recurrent, unspecified: Secondary | ICD-10-CM | POA: Diagnosis not present

## 2019-06-27 DIAGNOSIS — F411 Generalized anxiety disorder: Secondary | ICD-10-CM | POA: Diagnosis not present

## 2019-06-27 MED ORDER — FLUOXETINE HCL 40 MG PO CAPS: 40.0000 mg | ORAL_CAPSULE | Freq: Every day | ORAL | 0 refills | Status: AC

## 2019-06-27 NOTE — Assessment & Plan Note (Signed)
Assessment and Plan: PHQ is improving  He is seeing therapist every 2 weeks via TeleMedicine- continue as directed. Continue Fluoxetine 40mg  QD Continue regular exercise OTC Melatonin for insomnia, if not improved in a few weelks- call clinic Remain well hydrated, follow heart healthy diet Continue to abstain from tobacco/ETOH use- GREAT JOB!   Follow Up Instructions: PRN   I discussed the assessment and treatment plan with the patient. The patient was provided an opportunity to ask questions and all were answered. The patient agreed with the plan and demonstrated an understanding of the instructions.
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
## 2019-06-27 NOTE — Assessment & Plan Note (Signed)
Assessment and Plan: He is seeing therapist every 2 weeks via TeleMedicine- continue as directed. Continue Fluoxetine 40mg  QD Continue regular exercise OTC Melatonin for insomnia, if not improved in a few weelks- call clinic Remain well hydrated, follow heart healthy diet Continue to abstain from tobacco/ETOH use- GREAT JOB!   Follow Up Instructions: PRN   I discussed the assessment and treatment plan with the patient. The patient was provided an opportunity to ask questions and all were answered. The patient agreed with the plan and demonstrated an understanding of the instructions.

## 2019-06-29 ENCOUNTER — Ambulatory Visit (INDEPENDENT_AMBULATORY_CARE_PROVIDER_SITE_OTHER): Payer: BC Managed Care – PPO | Admitting: Psychology

## 2019-06-29 DIAGNOSIS — F902 Attention-deficit hyperactivity disorder, combined type: Secondary | ICD-10-CM

## 2019-07-27 ENCOUNTER — Ambulatory Visit: Payer: BC Managed Care – PPO | Admitting: Psychology

## 2019-09-04 ENCOUNTER — Encounter (HOSPITAL_COMMUNITY): Payer: Self-pay

## 2019-09-04 ENCOUNTER — Other Ambulatory Visit: Payer: Self-pay

## 2019-09-04 ENCOUNTER — Emergency Department (HOSPITAL_COMMUNITY)
Admission: EM | Admit: 2019-09-04 | Discharge: 2019-09-04 | Discharge status: Against Medical Advice | Payer: BC Managed Care – PPO | Attending: Emergency Medicine | Admitting: Emergency Medicine

## 2019-09-04 DIAGNOSIS — R0689 Other abnormalities of breathing: Secondary | ICD-10-CM | POA: Diagnosis not present

## 2019-09-04 DIAGNOSIS — R200 Anesthesia of skin: Secondary | ICD-10-CM | POA: Diagnosis not present

## 2019-09-04 DIAGNOSIS — R069 Unspecified abnormalities of breathing: Secondary | ICD-10-CM | POA: Diagnosis not present

## 2019-09-04 DIAGNOSIS — R079 Chest pain, unspecified: Secondary | ICD-10-CM | POA: Diagnosis not present

## 2019-09-04 DIAGNOSIS — Z5321 Procedure and treatment not carried out due to patient leaving prior to being seen by health care provider: Secondary | ICD-10-CM | POA: Diagnosis not present

## 2019-09-04 DIAGNOSIS — R42 Dizziness and giddiness: Secondary | ICD-10-CM | POA: Diagnosis not present
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
## 2020-05-08 ENCOUNTER — Other Ambulatory Visit: Payer: Self-pay

## 2020-05-08 ENCOUNTER — Encounter: Payer: Self-pay | Admitting: Physician Assistant

## 2020-05-08 ENCOUNTER — Ambulatory Visit (INDEPENDENT_AMBULATORY_CARE_PROVIDER_SITE_OTHER): Payer: BC Managed Care – PPO | Admitting: Physician Assistant

## 2020-05-08 VITALS — Ht 70.0 in | Wt 190.0 lb

## 2020-05-08 DIAGNOSIS — F909 Attention-deficit hyperactivity disorder, unspecified type: Secondary | ICD-10-CM | POA: Diagnosis not present

## 2020-05-08 MED ORDER — AMPHETAMINE-DEXTROAMPHET ER 20 MG PO CP24: 20.0000 mg | ORAL_CAPSULE | Freq: Two times a day (BID) | ORAL | 0 refills | Status: AC

## 2020-05-08 NOTE — Progress Notes (Signed)
Telehealth office visit note for Tony Masker, PA-C- at Primary Care at [facility]   I connected with current patient today by telephone and verified that I am speaking with the correct person   . Location of the patient: Home . Location of the provider: Office - This visit type was conducted due to national recommendations for restrictions regarding the COVID-19 Pandemic (e.g. social distancing) in an effort to limit this patient's exposure and mitigate transmission in our community.    - No physical exam could be performed with this format, beyond that communicated to [place] by the patient/ family members as noted.   - Additionally my office staff/ schedulers were to discuss with the patient that there may be a monetary charge related to this service, depending on their medical insurance.  My understanding is that patient understood and consented to proceed.     _________________________________________________________________________________   History of Present Illness: Pt calls in to restart Adderall medication. He discontinued medication since Covid-19 pandemic. States he has always dealt with his ADHD but it is currently getting worse and is having trouble with completing tasks at work. Previous regimen of Adderall 20 mg BID worked well since he works 12 hr shift and tolerated medication without side effects.      GAD 7 : Generalized Anxiety Score 05/28/2019 04/30/2019  Nervous, Anxious, on Edge 1 3  Control/stop worrying 3 3  Worry too much - different things 3 3  Trouble relaxing 2 3  Restless 1 3  Easily annoyed or irritable 1 3  Afraid - awful might happen 1 2  Total GAD 7 Score 12 20  Anxiety Difficulty Somewhat difficult Extremely difficult    Depression screen [facility] 2/9 05/08/2020 06/27/2019 05/28/2019 04/30/2019 09/21/2018  Decreased Interest 1 2 3 3 1   Down, Depressed, Hopeless 0 1 2 3  0  PHQ - 2 Score 1 3 5 6 1   Altered sleeping 0 3 2 3  0  Tired, decreased energy 1 3  2 1  0  Change in appetite 0 1 1 0 1  Feeling bad or failure about yourself  0 1 2 3  0  Trouble concentrating 1 3 3  0 0  Moving slowly or fidgety/restless 0 0 1 0 0  Suicidal thoughts 0 0 1 1 0  PHQ-9 Score 3 14 17 14 2   Difficult doing work/chores Not difficult at all Somewhat difficult Extremely dIfficult Extremely dIfficult Not difficult at all  Some recent data might be hidden      Impression and Recommendations:     1. Attention deficit hyperactivity disorder (ADHD), unspecified ADHD type     ADHD: - Discussed with patient OV required every 3 months for medication management and next visit should be in-office. Pt verbalized understanding.  - Reviewed [place] Controlled Substance database and last filled Adderall rx was 09/21/2018. - Provided 90 day supply.    - As part of my medical decision making, I reviewed the following data within the electronic MEDICAL RECORD NUMBER History obtained from pt /family, CMA notes reviewed and incorporated if applicable, Labs reviewed, Radiograph/ tests reviewed if applicable and OV notes from prior OV's with me, as well as any other specialists she/he has seen since seeing me last, were all reviewed and used in my medical decision making process today.    - Additionally, when appropriate, discussion had with patient regarding our treatment plan, and their biases/concerns about that plan were used in my medical decision making today.    -  The patient agreed with the plan and demonstrated an understanding of the instructions.   No barriers to understanding were identified.     - The patient was advised to call back or seek an in-person evaluation if the symptoms worsen or if the condition fails to improve as anticipated.   Return in about 3 months (around 08/08/2020) for ADHD (in office visit).    No orders of the defined types were placed in this encounter.   Meds ordered this encounter  Medications  . amphetamine-dextroamphetamine (ADDERALL XR) 20  MG 24 hr capsule    Sig: Take 1 capsule (20 mg total) by mouth 2 (two) times daily.    Dispense:  180 capsule    Refill:  0    Medications Discontinued During This Encounter  Medication Reason  . amphetamine-dextroamphetamine (ADDERALL XR) 20 MG 24 hr capsule Reorder       Time spent on visit including pre-visit chart review and post-visit care was 7 minutes.      The [place] was signed into law in 2016 which includes the topic of electronic health records.  This provides immediate access to information in MyChart.  This includes consultation notes, operative notes, office notes, lab results and pathology reports.  If you have any questions about what you read please let us know at your next visit or call us at the office.  We are right here with you.   __________________________________________________________________________________     Patient Care Team    Relationship Specialty Notifications Start End  Lorrene Reid, [place] PCP - General Physician Assistant  05/08/20      -Vitals obtained; medications/ allergies reconciled;  personal medical, social, Sx etc.histories were updated by CMA, reviewed by me and are reflected in chart   Patient Active Problem List   Diagnosis Date Noted  . GAD (generalized anxiety disorder) 04/30/2019  . Intermittent explosive disorder in adult 04/30/2019  . Depression, recurrent ([place]) 04/30/2019  . Fever and chills 06/21/2018  . Myalgia 06/21/2018  . Dysuria 06/21/2018  . Pruritus 06/05/2018  . Vitamin D deficiency 03/23/2018  . Acute maxillary sinusitis 03/23/2018  . Tobacco use disorder 01/19/2018  . Healthcare maintenance 12/08/2017  . ADHD 12/08/2017     Current Meds  Medication Sig  . amphetamine-dextroamphetamine (ADDERALL XR) 20 MG 24 hr capsule Take 1 capsule (20 mg total) by mouth 2 (two) times daily.  . [DISCONTINUED] amphetamine-dextroamphetamine (ADDERALL XR) 20 MG 24 hr capsule Take 20 mg by mouth 2  (two) times daily.     Allergies:  Allergies  Allergen Reactions  . Other Other (See Comments)    Steroids caused combativeness     ROS:  See above HPI for pertinent positives and negatives   Objective:   Height 5\' 10"  (1.778 m), weight 190 lb (86.2 kg).  (if some vitals are omitted, this means that patient was UNABLE to obtain them even though they were asked to get them prior to OV today.  They were asked to call us at their earliest convenience with these once obtained. ) General: A & O * 3; sounds in no acute distress; in usual state of health.  Chest: Patient confirms normal chest excursion and movement Respiratory: speaking in full sentences, no conversational dyspnea Psych: insight appears good, mood- appears full
